# Patient Record
Sex: Female | Born: 1940 | Race: White | Hispanic: No | Marital: Single | Smoking: Former smoker
Health system: Southern US, Community
[De-identification: ages and names within clinical notes are randomized; demographics above are authoritative.]

## PROBLEM LIST (undated history)

## (undated) DIAGNOSIS — N63 Unspecified lump in unspecified breast: Secondary | ICD-10-CM

## (undated) DIAGNOSIS — M1990 Unspecified osteoarthritis, unspecified site: Secondary | ICD-10-CM

## (undated) DIAGNOSIS — I10 Essential (primary) hypertension: Secondary | ICD-10-CM

## (undated) DIAGNOSIS — T7840XA Allergy, unspecified, initial encounter: Secondary | ICD-10-CM

## (undated) HISTORY — PX: CARPAL TUNNEL RELEASE: SHX101

## (undated) HISTORY — DX: Unspecified lump in unspecified breast: N63.0

## (undated) HISTORY — DX: Essential (primary) hypertension: I10

## (undated) HISTORY — DX: Allergy, unspecified, initial encounter: T78.40XA

## (undated) HISTORY — DX: Unspecified osteoarthritis, unspecified site: M19.90

## (undated) HISTORY — PX: FOOT SURGERY: SHX648

---

## 1973-05-25 HISTORY — PX: ABDOMINAL HYSTERECTOMY: SHX81

## 1995-05-26 HISTORY — PX: TOTAL KNEE ARTHROPLASTY: SHX125

## 1998-05-25 HISTORY — PX: TOTAL KNEE ARTHROPLASTY: SHX125

## 2002-05-25 HISTORY — PX: COLONOSCOPY: SHX174

## 2003-02-23 ENCOUNTER — Ambulatory Visit (HOSPITAL_COMMUNITY): Admission: RE | Admit: 2003-02-23 | Discharge: 2003-02-23 | Payer: Self-pay | Admitting: Internal Medicine

## 2004-09-18 ENCOUNTER — Ambulatory Visit: Payer: Self-pay | Admitting: Internal Medicine

## 2004-09-23 ENCOUNTER — Ambulatory Visit: Payer: Self-pay | Admitting: *Deleted

## 2004-11-04 ENCOUNTER — Ambulatory Visit: Payer: Self-pay | Admitting: Internal Medicine

## 2006-08-19 ENCOUNTER — Ambulatory Visit: Payer: Self-pay | Admitting: General Surgery

## 2006-08-25 ENCOUNTER — Ambulatory Visit: Payer: Self-pay | Admitting: General Surgery

## 2007-07-13 ENCOUNTER — Ambulatory Visit: Payer: Self-pay | Admitting: Internal Medicine

## 2008-09-12 ENCOUNTER — Ambulatory Visit: Payer: Self-pay | Admitting: Family Medicine

## 2008-11-13 ENCOUNTER — Ambulatory Visit: Payer: Self-pay | Admitting: Unknown Physician Specialty

## 2008-11-22 ENCOUNTER — Ambulatory Visit: Payer: Self-pay | Admitting: Gastroenterology

## 2009-04-30 ENCOUNTER — Emergency Department: Payer: Self-pay | Admitting: Emergency Medicine

## 2009-10-10 ENCOUNTER — Ambulatory Visit: Payer: Self-pay | Admitting: Family Medicine

## 2010-10-16 ENCOUNTER — Ambulatory Visit: Payer: Self-pay | Admitting: Family Medicine

## 2010-10-31 ENCOUNTER — Ambulatory Visit: Payer: Self-pay | Admitting: Physician Assistant

## 2010-10-31 ENCOUNTER — Ambulatory Visit: Payer: Self-pay | Admitting: Orthopaedic Surgery

## 2011-12-08 ENCOUNTER — Ambulatory Visit: Payer: Self-pay | Admitting: Family Medicine

## 2011-12-21 ENCOUNTER — Ambulatory Visit: Payer: Self-pay | Admitting: Family Medicine

## 2012-05-25 DIAGNOSIS — N63 Unspecified lump in unspecified breast: Secondary | ICD-10-CM

## 2012-05-25 HISTORY — DX: Unspecified lump in unspecified breast: N63.0

## 2012-06-30 ENCOUNTER — Ambulatory Visit: Payer: Self-pay | Admitting: Family Medicine

## 2012-07-04 ENCOUNTER — Ambulatory Visit: Payer: Self-pay | Admitting: Family Medicine

## 2012-08-26 ENCOUNTER — Encounter: Payer: Self-pay | Admitting: *Deleted

## 2012-08-26 DIAGNOSIS — N63 Unspecified lump in unspecified breast: Secondary | ICD-10-CM | POA: Insufficient documentation

## 2012-09-05 ENCOUNTER — Ambulatory Visit (INDEPENDENT_AMBULATORY_CARE_PROVIDER_SITE_OTHER): Payer: Medicare Other | Admitting: General Surgery

## 2012-09-05 ENCOUNTER — Encounter: Payer: Self-pay | Admitting: General Surgery

## 2012-09-05 ENCOUNTER — Other Ambulatory Visit: Payer: Self-pay

## 2012-09-05 VITALS — BP 128/68 | HR 78 | Resp 16 | Ht 68.0 in | Wt 231.0 lb

## 2012-09-05 DIAGNOSIS — N63 Unspecified lump in unspecified breast: Secondary | ICD-10-CM

## 2012-09-05 NOTE — Patient Instructions (Addendum)
Will inform pt when cytology available. If negative for any suspicious findings will follow with right mammogram.Patient to return in August 2014 with a unilateral right breast diagnostic mammogram.

## 2012-09-05 NOTE — Progress Notes (Signed)
Patient ID: Olivia Martin, female   DOB: 12-01-1940, 72 y.o.   MRN: [id number]  Chief Complaint  Patient presents with  . Follow-up    2 month right breast mass ultrasound    HPI Olivia Martin is a 72 y.o. female here today following up from a right breast mass ultrasound. Patient was last seen 07/13/12 for a right breast biopsy which was not performed at that time. Patient states no new breast problems. HPI  Past Medical History  Diagnosis Date  . Allergy   . Hypertension   . Arthritis   . Lump or mass in breast 2014    Past Surgical History  Procedure Laterality Date  . Carpal tunnel release Bilateral [id number]  . Total knee arthroplasty Right 2000  . Total knee arthroplasty Left 1997  . Abdominal hysterectomy  1975  . Foot surgery Bilateral 2012,2013  . Colonoscopy  2004    [place], [place]    Family History  Problem Relation Age of Onset  . Cancer Mother     lung cancer, died at age 27  . Cancer Sister 81    breast cancer  . Cancer Brother 26    lung cancer    Social History History  Substance Use Topics  . Smoking status: Former Smoker -- 1.00 packs/day for 20 years    Types: Cigarettes  . Smokeless tobacco: Never Used  . Alcohol Use: No    Allergies  Allergen Reactions  . Prednisone Swelling and Other (See Comments)    "felt like I couldn't breathe"    Current Outpatient Prescriptions  Medication Sig Dispense Refill  . amLODipine (NORVASC) 5 MG tablet Take 5 mg by mouth daily.      Marland Kitchen aspirin EC 81 MG tablet Take 81 mg by mouth daily.      . Calcium Carbonate-Vitamin D (CALCIUM 600 + D PO) Take by mouth.      . hydrochlorothiazide (HYDRODIURIL) 25 MG tablet Take 25 mg by mouth daily.      . Multiple Vitamins-Minerals (CENTRUM SILVER ULTRA WOMENS PO) Take 1 tablet by mouth daily.      . nebivolol (BYSTOLIC) 10 MG tablet Take 10 mg by mouth daily.      . Omega-3 Fatty Acids (FISH OIL) 1000 MG CAPS Take 1 capsule by mouth daily.      .  rosuvastatin (CRESTOR) 20 MG tablet Take 20 mg by mouth daily.      . sertraline (ZOLOFT) 50 MG tablet Take 50 mg by mouth daily.       No current facility-administered medications for this visit.    Review of Systems Review of Systems  Constitutional: Negative.   Respiratory: Negative.   Cardiovascular: Negative.     Blood pressure 128/68, pulse 78, resp. rate 16, height 5\' 8"  (1.727 m), weight 231 lb (104.781 kg).  Physical Exam Physical Exam  Constitutional: She appears well-developed and well-nourished.  Neck: No mass and no thyromegaly present.  Pulmonary/Chest: Right breast exhibits no inverted nipple, no mass, no nipple discharge, no skin change and no tenderness. Left breast exhibits no inverted nipple, no mass, no nipple discharge, no skin change and no tenderness. Breasts are symmetrical.    Data Reviewed US performed today again shows a triangular hypoechoic mass at 10 o'cl right as noted before , no changes.   Assessment    Right breast mass seen on imaging     Plan    FNA performed.  Abed Schar G 09/07/2012, 8:01 AM

## 2012-09-07 ENCOUNTER — Encounter: Payer: Self-pay | Admitting: General Surgery

## 2012-09-08 LAB — FINE-NEEDLE ASPIRATION

## 2012-09-12 ENCOUNTER — Encounter: Payer: Self-pay | Admitting: General Surgery

## 2012-09-13 ENCOUNTER — Telehealth: Payer: Self-pay | Admitting: *Deleted

## 2012-09-13 NOTE — Telephone Encounter (Signed)
Notified patient as instructed, patient pleased. Discussed follow-up appointments   

## 2012-09-13 NOTE — Telephone Encounter (Signed)
Message copied by Currie Paris on Tue Sep 13, 2012 10:00 AM ------      Message from: Kieth Brightly      Created: Mon Sep 12, 2012  1:55 PM       Please let pt pt know the cytology  was normal. Follow up as scheduled ------

## 2013-01-17 ENCOUNTER — Ambulatory Visit: Payer: Medicare Other | Admitting: General Surgery

## 2013-01-24 ENCOUNTER — Ambulatory Visit: Payer: Self-pay | Admitting: General Surgery

## 2013-01-25 ENCOUNTER — Encounter: Payer: Self-pay | Admitting: General Surgery

## 2013-01-25 ENCOUNTER — Ambulatory Visit: Payer: Self-pay | Admitting: General Surgery

## 2013-01-26 ENCOUNTER — Encounter: Payer: Self-pay | Admitting: General Surgery

## 2013-01-31 ENCOUNTER — Encounter: Payer: Self-pay | Admitting: General Surgery

## 2013-01-31 ENCOUNTER — Ambulatory Visit (INDEPENDENT_AMBULATORY_CARE_PROVIDER_SITE_OTHER): Payer: Medicare Other | Admitting: General Surgery

## 2013-01-31 VITALS — BP 130/80 | HR 74 | Resp 14 | Ht 68.0 in | Wt 226.0 lb

## 2013-01-31 DIAGNOSIS — N63 Unspecified lump in unspecified breast: Secondary | ICD-10-CM

## 2013-01-31 NOTE — Progress Notes (Signed)
Patient ID: Olivia Martin, female   DOB: 06-11-1940, 72 y.o.   MRN: [id number]  Chief Complaint  Patient presents with  . Follow-up    4 month follow up right breast mammogram.     HPI Olivia Martin is a 72 y.o. female who presents for a breast evaluation. The patient was initially seen in February 2014 for a right breast mass. A breast biopsy was not performed at that time. The patient was followed 2 months later with a breast ultrasound in our office. The most recent mammogram was done on 01/24/13 with a birad category 0. A right breat ultrasound was done on 01/25/13 with a birad category 2. Patient does not perform regular self breast checks but does gets regular mammograms done.  She denies any new problems with the breast at this time.    HPI  Past Medical History  Diagnosis Date  . Allergy   . Hypertension   . Arthritis   . Lump or mass in breast 2014    Past Surgical History  Procedure Laterality Date  . Carpal tunnel release Bilateral [id number]  . Total knee arthroplasty Right 2000  . Total knee arthroplasty Left 1997  . Abdominal hysterectomy  1975  . Foot surgery Bilateral 2012,2013  . Colonoscopy  2004    [place], [place]    Family History  Problem Relation Age of Onset  . Cancer Mother     lung cancer, died at age 33  . Cancer Sister 13    breast cancer  . Cancer Brother 100    lung cancer    Social History History  Substance Use Topics  . Smoking status: Former Smoker -- 1.00 packs/day for 20 years    Types: Cigarettes  . Smokeless tobacco: Never Used  . Alcohol Use: No    Allergies  Allergen Reactions  . Prednisone Swelling and Other (See Comments)    "felt like I couldn't breathe"    Current Outpatient Prescriptions  Medication Sig Dispense Refill  . amLODipine (NORVASC) 5 MG tablet Take 5 mg by mouth daily.      Marland Kitchen aspirin EC 81 MG tablet Take 81 mg by mouth daily.      . Calcium Carbonate-Vitamin D (CALCIUM 600 + D PO) Take by mouth.       . hydrochlorothiazide (HYDRODIURIL) 25 MG tablet Take 25 mg by mouth daily.      . Multiple Vitamins-Minerals (CENTRUM SILVER ULTRA WOMENS PO) Take 1 tablet by mouth daily.      . nebivolol (BYSTOLIC) 10 MG tablet Take 10 mg by mouth daily.      . Omega-3 Fatty Acids (FISH OIL) 1000 MG CAPS Take 1 capsule by mouth daily.      . rosuvastatin (CRESTOR) 20 MG tablet Take 20 mg by mouth daily.      . sertraline (ZOLOFT) 50 MG tablet Take 50 mg by mouth daily.       No current facility-administered medications for this visit.    Review of Systems Review of Systems  Constitutional: Negative.   Respiratory: Negative.   Cardiovascular: Negative.     Blood pressure 130/80, pulse 74, resp. rate 14, height 5\' 8"  (1.727 m), weight 226 lb (102.513 kg).  Physical Exam Physical Exam  Constitutional: She is oriented to person, place, and time. She appears well-developed and well-nourished.  Eyes: Conjunctivae are normal. No scleral icterus.  Neck: No thyromegaly present.  Pulmonary/Chest: Right breast exhibits no inverted nipple, no mass, no nipple discharge,  no skin change and no tenderness. Left breast exhibits no inverted nipple, no mass, no nipple discharge, no skin change and no tenderness.  Lymphadenopathy:    She has no cervical adenopathy.    She has no axillary adenopathy.  Neurological: She is alert and oriented to person, place, and time.  Skin: Skin is warm and dry.    Data Reviewed Mammogram and ultrasound reviewed.   Assessment    Exam stable. With a stable nodule right breast. Negative FNA 2 months ago.     Plan    Return in 1 year bilateral screening mammogram at [facility].        SANKAR,SEEPLAPUTHUR G 01/31/2013, 6:38 PM

## 2013-01-31 NOTE — Patient Instructions (Addendum)
Patient to return in 1 year with bilateral screening mammogram. 

## 2014-01-30 ENCOUNTER — Encounter: Payer: Self-pay | Admitting: General Surgery

## 2014-01-30 ENCOUNTER — Ambulatory Visit: Payer: Self-pay | Admitting: General Surgery

## 2014-02-13 ENCOUNTER — Encounter: Payer: Self-pay | Admitting: General Surgery

## 2014-02-13 ENCOUNTER — Ambulatory Visit (INDEPENDENT_AMBULATORY_CARE_PROVIDER_SITE_OTHER): Payer: Medicare Other | Admitting: General Surgery

## 2014-02-13 VITALS — BP 136/72 | HR 74 | Resp 16 | Ht 67.0 in | Wt 236.0 lb

## 2014-02-13 DIAGNOSIS — Z803 Family history of malignant neoplasm of breast: Secondary | ICD-10-CM | POA: Insufficient documentation

## 2014-02-13 DIAGNOSIS — N63 Unspecified lump in unspecified breast: Secondary | ICD-10-CM

## 2014-02-13 NOTE — Patient Instructions (Signed)
Patient will be asked to return to the office in one year with a bilateral screening mammogram. 

## 2014-02-13 NOTE — Progress Notes (Signed)
Patient ID: Olivia Martin, female   DOB: February 24, 1941, 73 y.o.   MRN: [id number]  Chief Complaint  Patient presents with  . Follow-up    mammogram    HPI Olivia Martin is a 73 y.o. female who presents for a breast evaluation. The most recent mammogram was done on 01/30/14 .  Patient does perform regular self breast checks and gets regular mammograms done.  Patient fell face first on  02/08/14 . States her breast are bruised.   HPI  Past Medical History  Diagnosis Date  . Allergy   . Hypertension   . Arthritis   . Lump or mass in breast 2014    Past Surgical History  Procedure Laterality Date  . Carpal tunnel release Bilateral [id number]  . Total knee arthroplasty Right 2000  . Total knee arthroplasty Left 1997  . Abdominal hysterectomy  1975  . Foot surgery Bilateral 2012,2013  . Colonoscopy  2004    [place], [place]    Family History  Problem Relation Age of Onset  . Cancer Mother     lung cancer, died at age 29  . Cancer Sister 49    breast cancer  . Cancer Brother 40    lung cancer    Social History History  Substance Use Topics  . Smoking status: Former Smoker -- 1.00 packs/day for 20 years    Types: Cigarettes  . Smokeless tobacco: Never Used  . Alcohol Use: No    Allergies  Allergen Reactions  . Prednisone Swelling and Other (See Comments)    "felt like I couldn't breathe"    Current Outpatient Prescriptions  Medication Sig Dispense Refill  . amLODipine (NORVASC) 5 MG tablet Take 5 mg by mouth daily.      Marland Kitchen aspirin EC 81 MG tablet Take 81 mg by mouth daily.      . Calcium Carbonate-Vitamin D (CALCIUM 600 + D PO) Take by mouth.      . hydrochlorothiazide (HYDRODIURIL) 25 MG tablet Take 25 mg by mouth daily.      . Multiple Vitamins-Minerals (CENTRUM SILVER ULTRA WOMENS PO) Take 1 tablet by mouth daily.      . nebivolol (BYSTOLIC) 10 MG tablet Take 10 mg by mouth daily.      . Omega-3 Fatty Acids (FISH OIL) 1000 MG CAPS Take 1 capsule by mouth  daily.      . rosuvastatin (CRESTOR) 20 MG tablet Take 20 mg by mouth daily.      . sertraline (ZOLOFT) 50 MG tablet Take 50 mg by mouth daily.       No current facility-administered medications for this visit.    Review of Systems Review of Systems  Constitutional: Negative.   Respiratory: Negative.   Cardiovascular: Negative.     Blood pressure 136/72, pulse 74, resp. rate 16, height  (1.702 m), weight 236 lb (107.049 kg).  Physical Exam Physical Exam  Constitutional: She is oriented to person, place, and time. She appears well-developed and well-nourished.  Eyes: Conjunctivae are normal. No scleral icterus.  Neck: Neck supple.  Cardiovascular: Normal rate, regular rhythm and normal heart sounds.   Pulmonary/Chest: Effort normal and breath sounds normal. Right breast exhibits no inverted nipple, no mass, no nipple discharge, no skin change and no tenderness. Left breast exhibits no inverted nipple, no mass, no nipple discharge, no skin change and no tenderness.  Abdominal: Soft. Bowel sounds are normal. There is no tenderness.  Lymphadenopathy:    She has no cervical adenopathy.  She has no axillary adenopathy.  Neurological: She is alert and oriented to person, place, and time.  Skin: Skin is warm and dry.    Data Reviewed Mammogram reviewed and stable  Assessment    Stable exam     Plan    Patient will be asked to return to the office in one year with a bilateral screening mammogram.       Olivia Martin 02/13/2014, 9:48 PM

## 2014-03-26 ENCOUNTER — Encounter: Payer: Self-pay | Admitting: General Surgery

## 2014-09-07 ENCOUNTER — Other Ambulatory Visit: Payer: Self-pay | Admitting: Family Medicine

## 2014-09-07 DIAGNOSIS — Z1231 Encounter for screening mammogram for malignant neoplasm of breast: Secondary | ICD-10-CM

## 2015-02-11 ENCOUNTER — Ambulatory Visit: Payer: Medicare Other | Admitting: General Surgery

## 2015-02-25 ENCOUNTER — Ambulatory Visit
Admission: RE | Admit: 2015-02-25 | Discharge: 2015-02-25 | Disposition: A | Discharge status: Home / Self Care | Payer: Medicare Other | Source: Ambulatory Visit | Attending: Family Medicine | Admitting: Family Medicine

## 2015-02-25 DIAGNOSIS — Z1231 Encounter for screening mammogram for malignant neoplasm of breast: Secondary | ICD-10-CM | POA: Diagnosis present

## 2015-03-05 ENCOUNTER — Ambulatory Visit: Payer: Medicare Other | Admitting: General Surgery

## 2015-05-08 ENCOUNTER — Encounter: Payer: Self-pay | Admitting: *Deleted

## 2016-03-19 ENCOUNTER — Other Ambulatory Visit: Payer: Self-pay | Admitting: Family Medicine

## 2016-03-19 DIAGNOSIS — Z1231 Encounter for screening mammogram for malignant neoplasm of breast: Secondary | ICD-10-CM

## 2016-04-24 ENCOUNTER — Ambulatory Visit
Admission: RE | Admit: 2016-04-24 | Discharge: 2016-04-24 | Disposition: A | Discharge status: Home / Self Care | Payer: Medicare Other | Source: Ambulatory Visit | Attending: Family Medicine | Admitting: Family Medicine

## 2016-04-24 DIAGNOSIS — Z1231 Encounter for screening mammogram for malignant neoplasm of breast: Secondary | ICD-10-CM | POA: Diagnosis present

## 2016-10-15 ENCOUNTER — Other Ambulatory Visit: Payer: Self-pay | Admitting: Nurse Practitioner

## 2016-10-15 DIAGNOSIS — R519 Headache, unspecified: Secondary | ICD-10-CM

## 2016-10-15 DIAGNOSIS — R51 Headache: Principal | ICD-10-CM

## 2016-11-03 ENCOUNTER — Ambulatory Visit: Payer: Medicare Other

## 2016-11-04 ENCOUNTER — Ambulatory Visit
Admission: RE | Admit: 2016-11-04 | Discharge: 2016-11-04 | Disposition: A | Discharge status: Home / Self Care | Payer: Medicare Other | Source: Ambulatory Visit | Attending: Nurse Practitioner | Admitting: Nurse Practitioner

## 2016-11-04 DIAGNOSIS — G319 Degenerative disease of nervous system, unspecified: Secondary | ICD-10-CM | POA: Insufficient documentation

## 2016-11-04 DIAGNOSIS — R519 Headache, unspecified: Secondary | ICD-10-CM

## 2016-11-04 DIAGNOSIS — R51 Headache: Secondary | ICD-10-CM | POA: Diagnosis present

## 2016-11-04 LAB — POCT I-STAT CREATININE: Creatinine, Ser: 0.9 mg/dL (ref 0.44–1.00)

## 2016-11-04 MED ORDER — GADOBENATE DIMEGLUMINE 529 MG/ML IV SOLN
20.0000 mL | Freq: Once | INTRAVENOUS | Status: AC | PRN
  Administered 2016-11-04: 20 mL via INTRAVENOUS

## 2017-03-30 ENCOUNTER — Other Ambulatory Visit: Payer: Self-pay | Admitting: Family Medicine

## 2017-03-30 DIAGNOSIS — Z1231 Encounter for screening mammogram for malignant neoplasm of breast: Secondary | ICD-10-CM

## 2017-04-27 ENCOUNTER — Ambulatory Visit
Admission: RE | Admit: 2017-04-27 | Discharge: 2017-04-27 | Disposition: A | Discharge status: Home / Self Care | Payer: Medicare Other | Source: Ambulatory Visit | Attending: Family Medicine | Admitting: Family Medicine

## 2017-04-27 DIAGNOSIS — Z1231 Encounter for screening mammogram for malignant neoplasm of breast: Secondary | ICD-10-CM | POA: Diagnosis present

## 2018-03-31 ENCOUNTER — Other Ambulatory Visit: Payer: Self-pay | Admitting: Family Medicine

## 2018-03-31 DIAGNOSIS — Z1231 Encounter for screening mammogram for malignant neoplasm of breast: Secondary | ICD-10-CM

## 2018-05-03 ENCOUNTER — Ambulatory Visit
Admission: RE | Admit: 2018-05-03 | Discharge: 2018-05-03 | Disposition: A | Discharge status: Home / Self Care | Payer: Medicare Other | Source: Ambulatory Visit | Attending: Family Medicine | Admitting: Family Medicine

## 2018-05-03 DIAGNOSIS — Z1231 Encounter for screening mammogram for malignant neoplasm of breast: Secondary | ICD-10-CM

## 2018-11-08 ENCOUNTER — Other Ambulatory Visit: Payer: Self-pay | Admitting: Neurology

## 2018-11-08 DIAGNOSIS — F0280 Dementia in other diseases classified elsewhere without behavioral disturbance: Secondary | ICD-10-CM

## 2018-11-08 DIAGNOSIS — G301 Alzheimer's disease with late onset: Secondary | ICD-10-CM

## 2018-11-16 ENCOUNTER — Ambulatory Visit
Admission: RE | Admit: 2018-11-16 | Discharge: 2018-11-16 | Disposition: A | Discharge status: Home / Self Care | Payer: Medicare Other | Source: Ambulatory Visit | Attending: Neurology | Admitting: Neurology

## 2018-11-16 ENCOUNTER — Other Ambulatory Visit: Payer: Self-pay

## 2018-11-16 DIAGNOSIS — F05 Delirium due to known physiological condition: Secondary | ICD-10-CM | POA: Diagnosis present

## 2018-11-16 DIAGNOSIS — G301 Alzheimer's disease with late onset: Secondary | ICD-10-CM | POA: Insufficient documentation

## 2018-11-16 DIAGNOSIS — F0282 Dementia in other diseases classified elsewhere, unspecified severity, with psychotic disturbance: Secondary | ICD-10-CM

## 2018-11-16 DIAGNOSIS — F0280 Dementia in other diseases classified elsewhere without behavioral disturbance: Secondary | ICD-10-CM | POA: Insufficient documentation

## 2019-01-20 ENCOUNTER — Other Ambulatory Visit: Payer: Self-pay | Admitting: *Deleted

## 2019-01-20 DIAGNOSIS — Z20822 Contact with and (suspected) exposure to covid-19: Secondary | ICD-10-CM

## 2019-01-21 LAB — NOVEL CORONAVIRUS, NAA: SARS-CoV-2, NAA: NOT DETECTED

## 2019-08-15 ENCOUNTER — Other Ambulatory Visit: Payer: Self-pay | Admitting: Gastroenterology

## 2019-08-15 DIAGNOSIS — R1310 Dysphagia, unspecified: Secondary | ICD-10-CM

## 2019-08-23 ENCOUNTER — Ambulatory Visit
Admission: RE | Admit: 2019-08-23 | Discharge: 2019-08-23 | Disposition: A | Discharge status: Home / Self Care | Payer: Medicare Other | Source: Ambulatory Visit | Attending: Gastroenterology | Admitting: Gastroenterology

## 2019-08-23 ENCOUNTER — Other Ambulatory Visit: Payer: Self-pay

## 2019-08-23 DIAGNOSIS — R1310 Dysphagia, unspecified: Secondary | ICD-10-CM | POA: Insufficient documentation

## 2020-11-23 ENCOUNTER — Emergency Department: Payer: Medicare Other

## 2020-11-23 ENCOUNTER — Other Ambulatory Visit: Payer: Self-pay

## 2020-11-23 ENCOUNTER — Inpatient Hospital Stay
Admission: EM | Admit: 2020-11-23 | Discharge: 2020-11-27 | DRG: 872 | Disposition: A | Discharge status: Home Health | Payer: Medicare Other | Attending: Internal Medicine | Admitting: Internal Medicine

## 2020-11-23 DIAGNOSIS — Z79899 Other long term (current) drug therapy: Secondary | ICD-10-CM

## 2020-11-23 DIAGNOSIS — R059 Cough, unspecified: Secondary | ICD-10-CM | POA: Diagnosis not present

## 2020-11-23 DIAGNOSIS — D696 Thrombocytopenia, unspecified: Secondary | ICD-10-CM | POA: Diagnosis present

## 2020-11-23 DIAGNOSIS — E785 Hyperlipidemia, unspecified: Secondary | ICD-10-CM

## 2020-11-23 DIAGNOSIS — G301 Alzheimer's disease with late onset: Secondary | ICD-10-CM

## 2020-11-23 DIAGNOSIS — Z87891 Personal history of nicotine dependence: Secondary | ICD-10-CM

## 2020-11-23 DIAGNOSIS — Z96653 Presence of artificial knee joint, bilateral: Secondary | ICD-10-CM | POA: Diagnosis present

## 2020-11-23 DIAGNOSIS — G8929 Other chronic pain: Secondary | ICD-10-CM | POA: Diagnosis present

## 2020-11-23 DIAGNOSIS — A419 Sepsis, unspecified organism: Principal | ICD-10-CM

## 2020-11-23 DIAGNOSIS — F0280 Dementia in other diseases classified elsewhere without behavioral disturbance: Secondary | ICD-10-CM

## 2020-11-23 DIAGNOSIS — E876 Hypokalemia: Secondary | ICD-10-CM | POA: Diagnosis present

## 2020-11-23 DIAGNOSIS — N390 Urinary tract infection, site not specified: Secondary | ICD-10-CM | POA: Diagnosis present

## 2020-11-23 DIAGNOSIS — Z28310 Unvaccinated for covid-19: Secondary | ICD-10-CM

## 2020-11-23 DIAGNOSIS — I10 Essential (primary) hypertension: Secondary | ICD-10-CM

## 2020-11-23 DIAGNOSIS — D72819 Decreased white blood cell count, unspecified: Secondary | ICD-10-CM | POA: Diagnosis present

## 2020-11-23 DIAGNOSIS — F32A Depression, unspecified: Secondary | ICD-10-CM

## 2020-11-23 DIAGNOSIS — Z7982 Long term (current) use of aspirin: Secondary | ICD-10-CM

## 2020-11-23 DIAGNOSIS — Z801 Family history of malignant neoplasm of trachea, bronchus and lung: Secondary | ICD-10-CM

## 2020-11-23 DIAGNOSIS — Z66 Do not resuscitate: Secondary | ICD-10-CM | POA: Diagnosis present

## 2020-11-23 DIAGNOSIS — R3129 Other microscopic hematuria: Secondary | ICD-10-CM | POA: Diagnosis present

## 2020-11-23 DIAGNOSIS — Z20822 Contact with and (suspected) exposure to covid-19: Secondary | ICD-10-CM | POA: Diagnosis present

## 2020-11-23 DIAGNOSIS — Z87440 Personal history of urinary (tract) infections: Secondary | ICD-10-CM

## 2020-11-23 DIAGNOSIS — G309 Alzheimer's disease, unspecified: Secondary | ICD-10-CM | POA: Diagnosis present

## 2020-11-23 DIAGNOSIS — M549 Dorsalgia, unspecified: Secondary | ICD-10-CM | POA: Diagnosis present

## 2020-11-23 DIAGNOSIS — Z803 Family history of malignant neoplasm of breast: Secondary | ICD-10-CM

## 2020-11-23 LAB — COMPREHENSIVE METABOLIC PANEL
ALT: 56 U/L — ABNORMAL HIGH (ref 0–44)
AST: 99 U/L — ABNORMAL HIGH (ref 15–41)
Albumin: 3.9 g/dL (ref 3.5–5.0)
Alkaline Phosphatase: 43 U/L (ref 38–126)
Anion gap: 12 (ref 5–15)
BUN: 21 mg/dL (ref 8–23)
CO2: 21 mmol/L — ABNORMAL LOW (ref 22–32)
Calcium: 9 mg/dL (ref 8.9–10.3)
Chloride: 105 mmol/L (ref 98–111)
Creatinine, Ser: 0.85 mg/dL (ref 0.44–1.00)
GFR, Estimated: >60 mL/min (ref >60)
Glucose, Bld: 155 mg/dL — ABNORMAL HIGH (ref 70–99)
Potassium: 2.9 mmol/L — ABNORMAL LOW (ref 3.5–5.1)
Sodium: 138 mmol/L (ref 135–145)
Total Bilirubin: 0.9 mg/dL (ref 0.3–1.2)
Total Protein: 7.5 g/dL (ref 6.5–8.1)

## 2020-11-23 LAB — URINALYSIS, COMPLETE (UACMP) WITH MICROSCOPIC
Bilirubin Urine: NEGATIVE
Glucose, UA: NEGATIVE mg/dL
Ketones, ur: 5 mg/dL — AB
Leukocytes,Ua: NEGATIVE
Nitrite: POSITIVE — AB
Protein, ur: NEGATIVE mg/dL
Specific Gravity, Urine: 1.028 (ref 1.005–1.030)
pH: 5 (ref 5.0–8.0)

## 2020-11-23 LAB — CBC WITH DIFFERENTIAL/PLATELET
Abs Immature Granulocytes: 0.02 10*3/uL (ref 0.00–0.07)
Basophils Absolute: 0 10*3/uL (ref 0.0–0.1)
Basophils Relative: 1 %
Eosinophils Absolute: 0 10*3/uL (ref 0.0–0.5)
Eosinophils Relative: 1 %
HCT: 43.7 % (ref 36.0–46.0)
Hemoglobin: 14.9 g/dL (ref 12.0–15.0)
Immature Granulocytes: 0 %
Lymphocytes Relative: 6 %
Lymphs Abs: 0.3 10*3/uL — ABNORMAL LOW (ref 0.7–4.0)
MCH: 32.4 pg (ref 26.0–34.0)
MCHC: 34.1 g/dL (ref 30.0–36.0)
MCV: 95 fL (ref 80.0–100.0)
Monocytes Absolute: 0.5 10*3/uL (ref 0.1–1.0)
Monocytes Relative: 8 %
Neutro Abs: 4.7 10*3/uL (ref 1.7–7.7)
Neutrophils Relative %: 84 %
Platelets: 114 10*3/uL — ABNORMAL LOW (ref 150–400)
RBC: 4.6 MIL/uL (ref 3.87–5.11)
RDW: 12.8 % (ref 11.5–15.5)
WBC: 5.6 10*3/uL (ref 4.0–10.5)
nRBC: 0 % (ref 0.0–0.2)

## 2020-11-23 LAB — PROTIME-INR
INR: 1.1 (ref 0.8–1.2)
Prothrombin Time: 14.2 seconds (ref 11.4–15.2)

## 2020-11-23 LAB — RESP PANEL BY RT-PCR (FLU A&B, COVID) ARPGX2
Influenza A by PCR: NEGATIVE
Influenza B by PCR: NEGATIVE
SARS Coronavirus 2 by RT PCR: NEGATIVE

## 2020-11-23 LAB — LACTIC ACID, PLASMA: Lactic Acid, Venous: 2.1 mmol/L (ref 0.5–1.9)

## 2020-11-23 LAB — MAGNESIUM: Magnesium: 2 mg/dL (ref 1.7–2.4)

## 2020-11-23 LAB — PROCALCITONIN: Procalcitonin: 0.19 ng/mL

## 2020-11-23 MED ORDER — LACTATED RINGERS IV SOLN: INTRAVENOUS | Status: AC

## 2020-11-23 MED ORDER — SODIUM CHLORIDE 0.9 % IV SOLN
2.0000 g | Freq: Once | INTRAVENOUS | Status: AC
  Administered 2020-11-23: 2 g via INTRAVENOUS
  Filled 2020-11-23: qty 2

## 2020-11-23 MED ORDER — VANCOMYCIN HCL IN DEXTROSE 1-5 GM/200ML-% IV SOLN
1000.0000 mg | Freq: Once | INTRAVENOUS | Status: AC
  Administered 2020-11-23: 1000 mg via INTRAVENOUS

## 2020-11-23 MED ORDER — PANTOPRAZOLE SODIUM 40 MG PO TBEC
40.0000 mg | DELAYED_RELEASE_TABLET | Freq: Every morning | ORAL | Status: DC
  Administered 2020-11-24 – 2020-11-27 (×4): 40 mg via ORAL
  Filled 2020-11-23 (×4): qty 1

## 2020-11-23 MED ORDER — SODIUM CHLORIDE 0.9 % IV BOLUS
1000.0000 mL | Freq: Once | INTRAVENOUS | Status: AC
  Administered 2020-11-23: 1000 mL via INTRAVENOUS

## 2020-11-23 MED ORDER — ONDANSETRON HCL 4 MG/2ML IJ SOLN: 4.0000 mg | Freq: Four times a day (QID) | INTRAMUSCULAR | Status: DC | PRN

## 2020-11-23 MED ORDER — ACETAMINOPHEN 325 MG PO TABS
650.0000 mg | ORAL_TABLET | Freq: Once | ORAL | Status: AC | PRN
  Administered 2020-11-23: 650 mg via ORAL
  Filled 2020-11-23: qty 2

## 2020-11-23 MED ORDER — ENOXAPARIN SODIUM 40 MG/0.4ML IJ SOSY
40.0000 mg | PREFILLED_SYRINGE | INTRAMUSCULAR | Status: DC
  Administered 2020-11-23: 40 mg via SUBCUTANEOUS
  Filled 2020-11-23: qty 0.4

## 2020-11-23 MED ORDER — MEMANTINE HCL 5 MG PO TABS
10.0000 mg | ORAL_TABLET | Freq: Two times a day (BID) | ORAL | Status: DC
  Administered 2020-11-23 – 2020-11-27 (×8): 10 mg via ORAL
  Filled 2020-11-23 (×8): qty 2

## 2020-11-23 MED ORDER — POTASSIUM CITRATE-CITRIC ACID 1100-334 MG/5ML PO SOLN
40.0000 meq | Freq: Once | ORAL | Status: DC
  Filled 2020-11-23: qty 20

## 2020-11-23 MED ORDER — METRONIDAZOLE 500 MG/100ML IV SOLN
500.0000 mg | Freq: Once | INTRAVENOUS | Status: AC
  Administered 2020-11-23: 500 mg via INTRAVENOUS
  Filled 2020-11-23: qty 100

## 2020-11-23 MED ORDER — ROSUVASTATIN CALCIUM 20 MG PO TABS: 20.0000 mg | ORAL_TABLET | Freq: Every day | ORAL | Status: DC

## 2020-11-23 MED ORDER — ROSUVASTATIN CALCIUM 10 MG PO TABS
20.0000 mg | ORAL_TABLET | Freq: Every day | ORAL | Status: DC
  Administered 2020-11-24 – 2020-11-26 (×3): 20 mg via ORAL
  Filled 2020-11-23: qty 2
  Filled 2020-11-23: qty 1
  Filled 2020-11-23 (×2): qty 2

## 2020-11-23 MED ORDER — SODIUM CHLORIDE 0.9 % IV BOLUS (SEPSIS)
1000.0000 mL | Freq: Once | INTRAVENOUS | Status: AC
  Administered 2020-11-23: 1000 mL via INTRAVENOUS

## 2020-11-23 MED ORDER — DIVALPROEX SODIUM 125 MG PO DR TAB
125.0000 mg | DELAYED_RELEASE_TABLET | Freq: Two times a day (BID) | ORAL | Status: DC
  Administered 2020-11-23 – 2020-11-26 (×6): 125 mg via ORAL
  Filled 2020-11-23 (×7): qty 1

## 2020-11-23 MED ORDER — VANCOMYCIN HCL IN DEXTROSE 1-5 GM/200ML-% IV SOLN
1000.0000 mg | Freq: Once | INTRAVENOUS | Status: AC
  Administered 2020-11-23: 1000 mg via INTRAVENOUS
  Filled 2020-11-23: qty 200

## 2020-11-23 MED ORDER — ASPIRIN EC 81 MG PO TBEC
81.0000 mg | DELAYED_RELEASE_TABLET | Freq: Every day | ORAL | Status: DC
  Administered 2020-11-24: 81 mg via ORAL
  Filled 2020-11-23: qty 1

## 2020-11-23 MED ORDER — SODIUM CHLORIDE 0.9 % IV SOLN
1.0000 g | INTRAVENOUS | Status: DC
  Administered 2020-11-24 – 2020-11-26 (×3): 1 g via INTRAVENOUS
  Filled 2020-11-23: qty 10
  Filled 2020-11-23: qty 1
  Filled 2020-11-23 (×2): qty 10

## 2020-11-23 MED ORDER — ACETAMINOPHEN 650 MG RE SUPP: 650.0000 mg | Freq: Four times a day (QID) | RECTAL | Status: DC | PRN

## 2020-11-23 MED ORDER — POTASSIUM CHLORIDE 10 MEQ/100ML IV SOLN
10.0000 meq | INTRAVENOUS | Status: AC
  Administered 2020-11-23 – 2020-11-24 (×3): 10 meq via INTRAVENOUS
  Filled 2020-11-23: qty 100

## 2020-11-23 MED ORDER — ONDANSETRON HCL 4 MG PO TABS: 4.0000 mg | ORAL_TABLET | Freq: Four times a day (QID) | ORAL | Status: DC | PRN

## 2020-11-23 MED ORDER — DONEPEZIL HCL 5 MG PO TABS
10.0000 mg | ORAL_TABLET | Freq: Every day | ORAL | Status: DC
  Administered 2020-11-23 – 2020-11-26 (×4): 10 mg via ORAL
  Filled 2020-11-23 (×4): qty 2

## 2020-11-23 MED ORDER — ACETAMINOPHEN 325 MG PO TABS
650.0000 mg | ORAL_TABLET | Freq: Four times a day (QID) | ORAL | Status: DC | PRN
  Administered 2020-11-25 – 2020-11-26 (×2): 650 mg via ORAL
  Filled 2020-11-23 (×2): qty 2

## 2020-11-23 MED ORDER — LACTATED RINGERS IV SOLN: INTRAVENOUS | Status: DC

## 2020-11-23 NOTE — Progress Notes (Signed)
PHARMACY -  BRIEF ANTIBIOTIC NOTE   Pharmacy has received consult(s) for Cefepime and Vancomcyin from an ED provider.  The patient's profile has been reviewed for ht/wt/allergies/indication/available labs.    RN messaged @ 1546 about need height and weight for patient.   One time order(s) placed for Vancomycin 1000mg  plus vancomycin 1000mg  (Total 2000mg ) and Cefepime 2g  Further antibiotics/pharmacy consults should be ordered by admitting physician if indicated.                       Thank you, , PharmD, BCPS Clinical Pharmacist 11/23/2020 3:51 PM

## 2020-11-23 NOTE — H&P (Addendum)
History and Physical   Olivia Martin NWG:[id number] DOB: 11-25-1940 DOA: 11/23/2020  PCP: [name], MD  Outpatient Specialists: Dr. Gillermo Murdoch cardiology Patient coming from: Urgent care  I have personally briefly reviewed patient's old medical records in [facility] EMR.  Chief Concern: Lethargy and confusion  HPI: Olivia Martin is a 80 y.o. female with medical history significant for hypertension, Alzheimer's dementia, hyperlipidemia, history of bradycardia, presents to the emergency department from urgent care/walk-in clinic for chief concerns of lethargy.  At bedside patient was able to tell me her full name.  She was not able to tell me her age.  She was not able to identify the daughter at bedside.  She does note that it is a daughter but does not know the daughter's name.  She does not know the current calendar year nor her current location.  Per daughter Olivia Martin at bedside, patient's lethargy and slow to move started on 11/22/20. The fever started today. Family denies vomiting. Patient has baseline loose stool, multiple times daily since starting dementia medication.  Patient also has chronic back pain that has been ongoing for about 1 year.  Daughter reports poor PO intake for two days.   Social history: She lives by herself and a daughter lives next door. She denies tobacco, etoh, recreational drug use. She is retired and formerly worked in a factory producing cigarettes.  Vaccination history: no covid 19 vaccination  ROS unable to complete due to Alzheimer dementia  ED Course: Discussed with emergency medicine provider, patient requiring hospitalization for meeting sepsis criteria.  Vitals in the emergency department was remarkable for temperature of 109.5, respiration rate of 20, heart rate 101, blood pressure 136/72, SPO2 of 91% on room air.  Labs in the emergency department was remarkable for sodium of 138, potassium 2.9, chloride 105, BUN 21, serum  creatinine of 0.  8 5, nonfasting blood glucose 155, WBC 5.6, hemoglobin 14.9, platelets 114.  UA was positive for nitrate.  AST 99, ALT 56.  Initial lactic acid was 2.1.  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   Depression   Alzheimer's dementia [facility])   # Patient meets sepsis criteria # Sepsis secondary to urinary tract infection - Elevated heart rate, fever, T-max at home of 102.9, source of urine - Status post 3 L normal saline bolus - EDP ordered lactated ringer 150 mL/h - Status post sepsis broad-spectrum antibiotic with vancomycin, metronidazole, cefepime per EDP - Ceftriaxone ordered by myself 1 g IV every 24 hours for UTI to start 11/24/2020 - Urine in pureWick cannister is dark - Blood cultures x2 and urine culture are in process  # Hypokalemia suspect secondary to hydrochlorothiazide - Replace with potassium 10 mill equivalent, x4, Polycitra 40 mill equivalent once - Add magnesium to previous cultures   # History of hypertension-patient took her hydrochlorothiazide 25 mg daily, losartan 25 mg daily, amlodipine 10 mg daily in the a.m. prior to seeking medical evaluation - As patient presents with hypotension meeting sepsis criteria, I have not resumed hydrochlorothiazide, losartan, amlodipine  # Hyperlipidemia-rosuvastatin 20 mg daily resumed  # Dementia, likely Alzheimer's-appears moderate to advanced - Resumed home donepezil 10 mg p.o. daily at bedtime - Memantine 10 mg p.o. twice daily  Chart reviewed.   Echo on 10/17/2020 was  read as ejection fraction estimated greater than 55%, normal left ventricular systolic and normal right ventricular systolic function.  No valvular stenosis.  Trivial MR.  Mild TR.  DVT prophylaxis: Enoxaparin 40 mg  subcutaneous every 24 hours Code Status: DNR Diet: Heart healthy Family Communication: Olivia Martin, daughter at bedside  Disposition Plan: Pending clinical course Consults called: no Admission status: med-surg, observation, telemetry for 24 hours  Past Medical History:  Diagnosis Date   Allergy    Arthritis    Hypertension    Lump or mass in breast 2014   Past Surgical History:  Procedure Laterality Date   ABDOMINAL HYSTERECTOMY  1975   CARPAL TUNNEL RELEASE Bilateral [id number]   COLONOSCOPY  2004   [place], [place]   FOOT SURGERY Bilateral 2012,2013   TOTAL KNEE ARTHROPLASTY Right 2000   TOTAL KNEE ARTHROPLASTY Left 1997   Social History:  reports that she has quit smoking. Her smoking use included cigarettes. She has a 20.00 pack-year smoking history. She has never used smokeless tobacco. She reports that she does not drink alcohol and does not use drugs.  Allergies  Allergen Reactions   Prednisone Swelling and Other (See Comments)    "felt like I couldn't breathe"   Family History  Problem Relation Age of Onset   Cancer Mother        lung cancer, died at age 73   Cancer Brother 68       lung cancer   Cancer Sister 43       breast cancer   Breast cancer Sister 17   Family history: Family history reviewed and not pertinent  Prior to Admission medications   Medication Sig Start Date End Date Taking? Authorizing Provider  amLODipine (NORVASC) 10 MG tablet Take 10 mg by mouth daily.   Yes [provider]  aspirin EC 81 MG tablet Take 81 mg by mouth daily.   Yes [provider]  hydrochlorothiazide (HYDRODIURIL) 25 MG tablet Take 25 mg by mouth daily.   Yes [provider]  rosuvastatin (CRESTOR) 20 MG tablet Take 20 mg by mouth daily.   Yes [provider]  sertraline (ZOLOFT) 50 MG tablet Take 25 mg by mouth daily.   Yes [provider]   Physical Exam: Vitals:   11/23/20 1800 11/23/20 1830 11/23/20 1900 11/23/20 1930   BP: (!) 90/44 (!) 75/61 (!) 76/50 101/66  Pulse: (!) 56 79 67 63  Resp: 15 20 18 19   Temp:      TempSrc:      SpO2: 94% 95% 94% 97%  Weight:      Height:       Constitutional: appears age-appropriate, frail, NAD, calm, comfortable Eyes: PERRL, lids and conjunctivae normal ENMT: Mucous membranes are dry. Posterior pharynx clear of any exudate or lesions. Age-appropriate dentition. Hearing appropriate Neck: normal, supple, no masses, no thyromegaly Respiratory: clear to auscultation bilaterally, no wheezing, no crackles. Normal respiratory effort. No accessory muscle use.  Cardiovascular: Regular rate and rhythm, no murmurs / rubs / gallops. No extremity edema. 2+ pedal pulses. No carotid bruits.  Abdomen: Obese abdomen with pannus, no tenderness, no masses palpated, no hepatosplenomegaly. Bowel sounds positive.  Musculoskeletal: no clubbing / cyanosis. No joint deformity upper and lower extremities. Good ROM, no contractures, no atrophy. Normal muscle tone.  GU: Pure wick Foley in place, urine in pure wick canister is dark Skin: no rashes, lesions, ulcers. No induration Neurologic: Sensation intact. Strength 5/5 in all 4.  Psychiatric: Normal judgment and  insight. Alert and oriented x self. Normal mood.   EKG: independently reviewed, showing sinus rhythm with rate of 99, QTc 467  Chest x-ray on Admission: I personally reviewed and I agree with radiologist reading as below.  DG Chest 2 View  Result Date: 11/23/2020 CLINICAL DATA:  Suspected sepsis. EXAM: CHEST - 2 VIEW COMPARISON:  07/13/2007 FINDINGS: Stable elevation of the RIGHT hemidiaphragm. Heart size is normal. The lungs are free of focal consolidations and pleural effusions. No pulmonary edema. Moderate thoracic degenerative changes. IMPRESSION: No evidence for acute  abnormality. Electronically Signed   By: Norva Pavlov M.D.   On: 11/23/2020 13:23    Labs on Admission: I have personally reviewed following  labs  CBC: Recent Labs  Lab 11/23/20 1236  WBC 5.6  NEUTROABS 4.7  HGB 14.9  HCT 43.7  MCV 95.0  PLT 114*   Basic Metabolic Panel: Recent Labs  Lab 11/23/20 1236  NA 138  K 2.9*  CL 105  CO2 21*  GLUCOSE 155*  BUN 21  CREATININE 0.85  CALCIUM 9.0   GFR: Estimated Creatinine Clearance: 60.6 mL/min (by C-G formula based on SCr of 0.85 mg/dL).  Liver Function Tests: Recent Labs  Lab 11/23/20 1236  AST 99*  ALT 56*  ALKPHOS 43  BILITOT 0.9  PROT 7.5  ALBUMIN 3.9   Coagulation Profile: Recent Labs  Lab 11/23/20 1236  INR 1.1   Urine analysis:    Component Value Date/Time   COLORURINE AMBER (A) 11/23/2020 1822   APPEARANCEUR HAZY (A) 11/23/2020 1822   LABSPEC 1.028 11/23/2020 1822   PHURINE 5.0 11/23/2020 1822   GLUCOSEU NEGATIVE 11/23/2020 1822   HGBUR MODERATE (A) 11/23/2020 1822   BILIRUBINUR NEGATIVE 11/23/2020 1822   KETONESUR 5 (A) 11/23/2020 1822   PROTEINUR NEGATIVE 11/23/2020 1822   NITRITE POSITIVE (A) 11/23/2020 1822   LEUKOCYTESUR NEGATIVE 11/23/2020 1822   CRITICAL CARE Performed by: Nadyne Coombes Wray Goehring  Total critical care time: 30 minutes  Critical care time was exclusive of separately billable procedures and treating other patients.  Critical care was necessary to treat or prevent imminent or life-threatening deterioration.  Critical care was time spent personally by me on the following activities: development of treatment plan with patient and/or surrogate as well as nursing, discussions with consultants, evaluation of patient's response to treatment, examination of patient, obtaining history from patient or surrogate, ordering and performing treatments and interventions, ordering and review of laboratory studies, ordering and review of radiographic studies, pulse oximetry and re-evaluation of patient's condition.  Dr. Sedalia Muta [facility]  If 7PM-7AM, please contact overnight-coverage provider If 7AM-7PM, please contact day coverage  provider [email or web address]  11/23/2020, 8:01 PM

## 2020-11-23 NOTE — ED Provider Notes (Signed)
[facility] Emergency Department Provider Note  Time seen: 3:47 PM  I have reviewed the triage vital signs and the nursing notes.   HISTORY  Chief Complaint Weakness   HPI Olivia Martin is a 80 y.o. female with a past medical history of allergies, arthritis, hypertension, presents to the emergency department for fever and generalized weakness.  According to the daughter since yesterday patient has been very weak and short of breath at times.  Noted to have a fever today.  Daughter states a history of frequent urinary tract infections.  Patient is too weak to stand or ambulate today.  At baseline patient ambulates with a walker but no other assistance needed.  Here the patient is awake and alert, no distress.  Slight cough this morning per daughter.  No vomiting, chronic diarrhea.  No dysuria.  No abdominal pain, no chest pain.   Past Medical History:  Diagnosis Date   Allergy    Arthritis    Hypertension    Lump or mass in breast 2014    Patient Active Problem List   Diagnosis Date Noted   Family history of breast cancer 02/13/2014   Lump or mass in breast     Past Surgical History:  Procedure Laterality Date   ABDOMINAL HYSTERECTOMY  1975   CARPAL TUNNEL RELEASE Bilateral [id number]   COLONOSCOPY  2004   [place], [place]   FOOT SURGERY Bilateral 2012,2013   TOTAL KNEE ARTHROPLASTY Right 2000   TOTAL KNEE ARTHROPLASTY Left 1997    Prior to Admission medications   Medication Sig Start Date End Date Taking? Authorizing Provider  amLODipine (NORVASC) 5 MG tablet Take 5 mg by mouth daily.    [provider]  aspirin EC 81 MG tablet Take 81 mg by mouth daily.    [provider]  Calcium Carbonate-Vitamin D (CALCIUM 600 + D PO) Take by mouth.    [provider]  hydrochlorothiazide (HYDRODIURIL) 25 MG tablet Take 25 mg by mouth daily.    [provider]  Multiple Vitamins-Minerals (CENTRUM SILVER ULTRA WOMENS PO)  Take 1 tablet by mouth daily.    [provider]  nebivolol (BYSTOLIC) 10 MG tablet Take 10 mg by mouth daily.    [provider]  Omega-3 Fatty Acids (FISH OIL) 1000 MG CAPS Take 1 capsule by mouth daily.    [provider]  rosuvastatin (CRESTOR) 20 MG tablet Take 20 mg by mouth daily.    [provider]  sertraline (ZOLOFT) 50 MG tablet Take 50 mg by mouth daily.    [provider]    Allergies  Allergen Reactions   Prednisone Swelling and Other (See Comments)    "felt like I couldn't breathe"    Family History  Problem Relation Age of Onset   Cancer Mother        lung cancer, died at age 25   Cancer Brother 32       lung cancer   Cancer Sister 36       breast cancer   Breast cancer Sister 40    Social History Social History   Tobacco Use   Smoking status: Former    Packs/day: 1.00    Years: 20.00    Pack years: 20.00    Types: Cigarettes   Smokeless tobacco: Never  Substance Use Topics   Alcohol use: No   Drug use: No    Review of Systems Constitutional: Positive for fever Cardiovascular: Negative for chest pain.  Respiratory: Positive for shortness of breath and cough since yesterday. Gastrointestinal: Negative for abdominal pain, vomiting.  Chronic diarrhea. Genitourinary: Negative for urinary compaints Musculoskeletal: Negative for musculoskeletal complaints Neurological: Negative for headache All other ROS negative  ____________________________________________   PHYSICAL EXAM:  VITAL SIGNS: ED Triage Vitals  Enc Vitals Group     BP 11/23/20 1232 136/72     Pulse Rate 11/23/20 1232 (!) 101     Resp 11/23/20 1232 20     Temp 11/23/20 1232 (!) 101.5 F (38.6 C)     Temp Source 11/23/20 1232 Oral     SpO2 11/23/20 1232 91 %     Weight --      Height --      Head Circumference --      Peak Flow --      Pain Score 11/23/20 1233 4     Pain Loc --      Pain Edu? --      Excl. in GC? --     Constitutional: Awake and alert well appearing and in no distress. Eyes: Normal exam ENT      Head: Normocephalic and atraumatic.      Mouth/Throat: Mucous membranes are moist. Cardiovascular: Normal rate, regular rhythm.  Respiratory: Normal respiratory effort without tachypnea nor retractions. Breath sounds are clear  Gastrointestinal: Soft and nontender. No distention.   Musculoskeletal: Nontender with normal range of motion in all extremities.  Neurologic:  Normal speech and language. No gross focal neurologic deficits  Skin:  Skin is warm, dry and intact.  Psychiatric: Mood and affect are normal.   ____________________________________________    EKG  EKG viewed and interpreted by myself shows a normal sinus rhythm at 99 bpm with a narrow QRS, normal axis, normal intervals, no concerning ST changes  ____________________________________________    RADIOLOGY  Chest x-ray is negative  ____________________________________________   INITIAL IMPRESSION / ASSESSMENT AND PLAN / ED COURSE  Pertinent labs & imaging results that were available during my care of the patient were reviewed by me and considered in my medical decision making (see chart for details).   Patient presents to the emergency department for shortness of breath and cough, fever and weakness.  Daughter states history of UTIs.  Patient's work-up thus far shows an elevated lactic acid of 2.1, normal white blood cell count.  Given the patient's initial tachycardia fever elevated lactate we will treat with antibiotics, IV fluids.  We will check labs including COVID and continue to closely monitor, urinalysis pending.  Given the patient's weakness inability to ambulate and fever anticipate likely admission to the hospital once the emergency department work-up is been completed.  Patient's work-up is most consistent with a urinary tract infection.  Given the patient's weakness and hypotension she received 30 mils per  kilo.  She will be admitted to the hospital service for further work-up treatment and antibiotics.  Olivia Martin was evaluated in Emergency Department on 11/23/2020 for the symptoms described in the history of present illness. She was evaluated in the context of the global COVID-19 pandemic, which necessitated consideration that the patient might be at risk for infection with the SARS-CoV-2 virus that causes COVID-19. Institutional protocols and algorithms that pertain to the evaluation of patients at risk for COVID-19 are in a state of rapid change based on information released by regulatory bodies including the CDC and federal and state organizations. These policies and algorithms were followed during the patient's care in the ED.  CRITICAL CARE  Performed by: Minna Antis   Total critical care time: 30 minutes  Critical care time was exclusive of separately billable procedures and treating other patients.  Critical care was necessary to treat or prevent imminent or life-threatening deterioration.  Critical care was time spent personally by me on the following activities: development of treatment plan with patient and/or surrogate as well as nursing, discussions with consultants, evaluation of patient's response to treatment, examination of patient, obtaining history from patient or surrogate, ordering and performing treatments and interventions, ordering and review of laboratory studies, ordering and review of radiographic studies, pulse oximetry and re-evaluation of patient's condition.  ____________________________________________   FINAL CLINICAL IMPRESSION(S) / ED DIAGNOSES  Sepsis UTI   Minna Antis, MD 11/23/20 2330

## 2020-11-23 NOTE — ED Triage Notes (Addendum)
FIRST NURSE NOTE: Pt to ED from [facility] for generalized weakness that started yesterday, dizziness that started this am and fevers., max 102.9  Daughter thinks has had a UTI

## 2020-11-23 NOTE — ED Triage Notes (Signed)
Pt to ED From [facility] for generalized weakness that started yesterday, dizziness today and fevers.  Pt c/o generalized abd pain Hx dementia

## 2020-11-23 NOTE — Sepsis Progress Note (Signed)
Notified provider of need to order additional  fluid bolus. 

## 2020-11-23 NOTE — Progress Notes (Signed)
CODE SEPSIS - PHARMACY COMMUNICATION  **Broad Spectrum Antibiotics should be administered within 1 hour of Sepsis diagnosis**  Time Code Sepsis Called/Page Received: @ 1542  Antibiotics Ordered: Vancomycin, Cefepime, Flagyl   Time of 1st antibiotic administration: 1613  Additional action taken by pharmacy: Messaged RN about adding weight to Epic @ 1546  If necessary, Name of Provider/Nurse Contacted:  Laren Boom, RN   Gardner Candle, PharmD, BCPS Clinical Pharmacist 11/23/2020 3:48 PM

## 2020-11-23 NOTE — Sepsis Progress Note (Signed)
Sepsis protocol is being monitored by eLink. 

## 2020-11-23 NOTE — Progress Notes (Signed)
ED Bedside RN Jill Alexanders aware of need for 2nd LA

## 2020-11-24 ENCOUNTER — Observation Stay: Payer: Medicare Other

## 2020-11-24 ENCOUNTER — Encounter: Payer: Self-pay | Admitting: Family Medicine

## 2020-11-24 ENCOUNTER — Other Ambulatory Visit: Payer: Self-pay

## 2020-11-24 DIAGNOSIS — Z515 Encounter for palliative care: Secondary | ICD-10-CM | POA: Diagnosis not present

## 2020-11-24 DIAGNOSIS — R059 Cough, unspecified: Secondary | ICD-10-CM | POA: Diagnosis present

## 2020-11-24 DIAGNOSIS — R3129 Other microscopic hematuria: Secondary | ICD-10-CM | POA: Diagnosis present

## 2020-11-24 DIAGNOSIS — Z7189 Other specified counseling: Secondary | ICD-10-CM | POA: Diagnosis not present

## 2020-11-24 DIAGNOSIS — I10 Essential (primary) hypertension: Secondary | ICD-10-CM | POA: Diagnosis present

## 2020-11-24 DIAGNOSIS — N390 Urinary tract infection, site not specified: Secondary | ICD-10-CM | POA: Diagnosis present

## 2020-11-24 DIAGNOSIS — Z79899 Other long term (current) drug therapy: Secondary | ICD-10-CM | POA: Diagnosis not present

## 2020-11-24 DIAGNOSIS — E876 Hypokalemia: Secondary | ICD-10-CM | POA: Diagnosis present

## 2020-11-24 DIAGNOSIS — Z28310 Unvaccinated for covid-19: Secondary | ICD-10-CM | POA: Diagnosis not present

## 2020-11-24 DIAGNOSIS — Z66 Do not resuscitate: Secondary | ICD-10-CM | POA: Diagnosis present

## 2020-11-24 DIAGNOSIS — D72819 Decreased white blood cell count, unspecified: Secondary | ICD-10-CM | POA: Diagnosis present

## 2020-11-24 DIAGNOSIS — Z87891 Personal history of nicotine dependence: Secondary | ICD-10-CM | POA: Diagnosis not present

## 2020-11-24 DIAGNOSIS — F0280 Dementia in other diseases classified elsewhere without behavioral disturbance: Secondary | ICD-10-CM | POA: Diagnosis present

## 2020-11-24 DIAGNOSIS — Z7982 Long term (current) use of aspirin: Secondary | ICD-10-CM | POA: Diagnosis not present

## 2020-11-24 DIAGNOSIS — G301 Alzheimer's disease with late onset: Secondary | ICD-10-CM | POA: Diagnosis not present

## 2020-11-24 DIAGNOSIS — Z801 Family history of malignant neoplasm of trachea, bronchus and lung: Secondary | ICD-10-CM | POA: Diagnosis not present

## 2020-11-24 DIAGNOSIS — Z20822 Contact with and (suspected) exposure to covid-19: Secondary | ICD-10-CM | POA: Diagnosis present

## 2020-11-24 DIAGNOSIS — Z96653 Presence of artificial knee joint, bilateral: Secondary | ICD-10-CM | POA: Diagnosis present

## 2020-11-24 DIAGNOSIS — F32A Depression, unspecified: Secondary | ICD-10-CM | POA: Diagnosis present

## 2020-11-24 DIAGNOSIS — G309 Alzheimer's disease, unspecified: Secondary | ICD-10-CM | POA: Diagnosis present

## 2020-11-24 DIAGNOSIS — M549 Dorsalgia, unspecified: Secondary | ICD-10-CM | POA: Diagnosis present

## 2020-11-24 DIAGNOSIS — G8929 Other chronic pain: Secondary | ICD-10-CM | POA: Diagnosis present

## 2020-11-24 DIAGNOSIS — Z87440 Personal history of urinary (tract) infections: Secondary | ICD-10-CM | POA: Diagnosis not present

## 2020-11-24 DIAGNOSIS — E785 Hyperlipidemia, unspecified: Secondary | ICD-10-CM | POA: Diagnosis present

## 2020-11-24 DIAGNOSIS — A419 Sepsis, unspecified organism: Secondary | ICD-10-CM | POA: Diagnosis present

## 2020-11-24 DIAGNOSIS — D696 Thrombocytopenia, unspecified: Secondary | ICD-10-CM | POA: Diagnosis present

## 2020-11-24 LAB — CBC WITH DIFFERENTIAL/PLATELET
Abs Immature Granulocytes: 0.01 10*3/uL (ref 0.00–0.07)
Basophils Absolute: 0 10*3/uL (ref 0.0–0.1)
Basophils Relative: 0 %
Eosinophils Absolute: 0 10*3/uL (ref 0.0–0.5)
Eosinophils Relative: 0 %
HCT: 36.2 % (ref 36.0–46.0)
Hemoglobin: 12.2 g/dL (ref 12.0–15.0)
Immature Granulocytes: 0 %
Lymphocytes Relative: 14 %
Lymphs Abs: 0.5 10*3/uL — ABNORMAL LOW (ref 0.7–4.0)
MCH: 33 pg (ref 26.0–34.0)
MCHC: 33.7 g/dL (ref 30.0–36.0)
MCV: 97.8 fL (ref 80.0–100.0)
Monocytes Absolute: 0.3 10*3/uL (ref 0.1–1.0)
Monocytes Relative: 8 %
Neutro Abs: 2.7 10*3/uL (ref 1.7–7.7)
Neutrophils Relative %: 78 %
Platelets: 70 10*3/uL — ABNORMAL LOW (ref 150–400)
RBC: 3.7 MIL/uL — ABNORMAL LOW (ref 3.87–5.11)
RDW: 12.7 % (ref 11.5–15.5)
WBC: 3.5 10*3/uL — ABNORMAL LOW (ref 4.0–10.5)
nRBC: 0 % (ref 0.0–0.2)

## 2020-11-24 LAB — BASIC METABOLIC PANEL
Anion gap: 9 (ref 5–15)
BUN: 12 mg/dL (ref 8–23)
CO2: 18 mmol/L — ABNORMAL LOW (ref 22–32)
Calcium: 7.7 mg/dL — ABNORMAL LOW (ref 8.9–10.3)
Chloride: 107 mmol/L (ref 98–111)
Creatinine, Ser: 0.75 mg/dL (ref 0.44–1.00)
GFR, Estimated: >60 mL/min (ref >60)
Glucose, Bld: 129 mg/dL — ABNORMAL HIGH (ref 70–99)
Potassium: 3.7 mmol/L (ref 3.5–5.1)
Sodium: 134 mmol/L — ABNORMAL LOW (ref 135–145)

## 2020-11-24 LAB — COMPREHENSIVE METABOLIC PANEL
ALT: 41 U/L (ref 0–44)
AST: 67 U/L — ABNORMAL HIGH (ref 15–41)
Albumin: 2.7 g/dL — ABNORMAL LOW (ref 3.5–5.0)
Alkaline Phosphatase: 31 U/L — ABNORMAL LOW (ref 38–126)
Anion gap: 8 (ref 5–15)
BUN: 12 mg/dL (ref 8–23)
CO2: 21 mmol/L — ABNORMAL LOW (ref 22–32)
Calcium: 7.3 mg/dL — ABNORMAL LOW (ref 8.9–10.3)
Chloride: 107 mmol/L (ref 98–111)
Creatinine, Ser: 0.61 mg/dL (ref 0.44–1.00)
GFR, Estimated: >60 mL/min (ref >60)
Glucose, Bld: 95 mg/dL (ref 70–99)
Potassium: 2.7 mmol/L — CL (ref 3.5–5.1)
Sodium: 136 mmol/L (ref 135–145)
Total Bilirubin: 0.6 mg/dL (ref 0.3–1.2)
Total Protein: 5.3 g/dL — ABNORMAL LOW (ref 6.5–8.1)

## 2020-11-24 MED ORDER — POTASSIUM CHLORIDE 10 MEQ/100ML IV SOLN
INTRAVENOUS | Status: AC
  Administered 2020-11-24: 10 meq via INTRAVENOUS
  Filled 2020-11-24: qty 100

## 2020-11-24 MED ORDER — POTASSIUM CHLORIDE CRYS ER 20 MEQ PO TBCR
40.0000 meq | EXTENDED_RELEASE_TABLET | ORAL | Status: AC
  Administered 2020-11-24 (×3): 40 meq via ORAL
  Filled 2020-11-24 (×3): qty 2

## 2020-11-24 NOTE — Progress Notes (Signed)
PROGRESS NOTE    Olivia Martin  OVF:[id number] DOB: December 10, 1940 DOA: 11/23/2020 PCP: [name], MD   Chief Complaint  Patient presents with   Weakness    Brief Narrative:   Olivia Martin is Olivia Martin 80 y.o. female with medical history significant for hypertension, Alzheimer's dementia, hyperlipidemia, history of bradycardia, presents to the emergency department from urgent care/walk-in clinic for chief concerns of lethargy.  She's been admitted for weakness, malaise, lethargy with UA findings concerning for UTI.   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   Depression   Alzheimer's dementia (HCC)  # Sepsis 2/2 UTI - sepsis due to fever, tachy, tachypnea and UA c/w UTI - s/p 3 L NS - continue ceftriaxone - blood and urine cultures pending  # Hypokalemia - severe, replace aggressively and follow - mag normal, follow - likely related to HCTZ    # Thrombocytopenia - likely 2/2 infection/sepsis above - trend  # History of hypertension - continue to hold hydrochlorothiazide, losartan, amlodipine given reasonable BP's today off these meds   # Hyperlipidemia-rosuvastatin 20 mg daily resumed   # Dementia, likely Alzheimer's-appears moderate to advanced - Resumed home donepezil 10 mg p.o. daily at bedtime - Memantine 10 mg p.o. twice daily  # on depakote, mood stabilizer? Will need to review with daughter   Chart reviewed.    Echo on 10/17/2020 was read as ejection fraction estimated greater than 55%, normal left ventricular systolic and normal right ventricular systolic function.  No valvular stenosis.  Trivial MR.  Mild TR.  DVT prophylaxis: SCD Code Status: DNR Family Communication: daughter at bedside Disposition:   Status is: Inpatient  Remains inpatient appropriate because:Inpatient  level of care appropriate due to severity of illness  Dispo: The patient is from:  home              Anticipated d/c is to:  pending              Patient currently is not medically stable to d/c.   Difficult to place patient No       Consultants:  none  Procedures:  none  Antimicrobials: Anti-infectives (From admission, onward)    Start     Dose/Rate Route Frequency Ordered Stop   11/24/20 0800  cefTRIAXone (ROCEPHIN) 1 g in sodium chloride 0.9 % 100 mL IVPB        1 g 200 mL/hr over 30 Minutes Intravenous Every 24 hours 11/23/20 1959     11/23/20 1700  vancomycin (VANCOCIN) IVPB 1000 mg/200 mL premix        1,000 mg 200 mL/hr over 60 Minutes Intravenous  Once 11/23/20 1600 11/24/20 0020   11/23/20 1545  ceFEPIme (MAXIPIME) 2 g in sodium chloride 0.9 % 100 mL IVPB        2 g 200 mL/hr over 30 Minutes Intravenous  Once 11/23/20 1542 11/23/20 1646   11/23/20 1545  metroNIDAZOLE (FLAGYL) IVPB 500 mg        500 mg 100 mL/hr over 60 Minutes Intravenous  Once 11/23/20 1542 11/23/20 1807   11/23/20 1545  vancomycin (VANCOCIN) IVPB 1000 mg/200 mL premix        1,000 mg 200 mL/hr over 60 Minutes Intravenous  Once 11/23/20 1542 11/23/20 2336  Subjective: Can't tell me why she's here - misnames daughter Daughter shares she came because of lethargy, weakness, fever  Objective: Vitals:   11/24/20 0631 11/24/20 0830 11/24/20 1155 11/24/20 1356  BP: 120/66 128/62 104/79   Pulse: 62 65 69   Resp: 20 (!) 23 18 18   Temp:      TempSrc: Oral     SpO2: 95% 94% 98% 97%  Weight:      Height:        Intake/Output Summary (Last 24 hours) at 11/24/2020 1421 Last data filed at 11/24/2020 0553 Gross per 24 hour  Intake 4522.33 ml  Output --  Net 4522.33 ml   Filed Weights   11/23/20 1548  Weight: 92.5 kg    Examination:  General exam: Appears calm and comfortable  Respiratory system: Clear to auscultation. Respiratory effort normal. Cardiovascular system: S1 & S2  heard, RRR.  Gastrointestinal system: Abdomen is nondistended, soft and nontender.  Central nervous system: Alert, but confused No focal neurological deficits. Extremities: no LEE Skin: No rashes, lesions or ulcers    Data Reviewed: I have personally reviewed following labs and imaging studies  CBC: Recent Labs  Lab 11/23/20 1236 11/24/20 1157  WBC 5.6 3.5*  NEUTROABS 4.7 2.7  HGB 14.9 12.2  HCT 43.7 36.2  MCV 95.0 97.8  PLT 114* 70*    Basic Metabolic Panel: Recent Labs  Lab 11/23/20 1236 11/24/20 1157  NA 138 136  K 2.9* 2.7*  CL 105 107  CO2 21* 21*  GLUCOSE 155* 95  BUN 21 12  CREATININE 0.85 0.61  CALCIUM 9.0 7.3*  MG 2.0  --     GFR: Estimated Creatinine Clearance: 64.4 mL/min (by C-G formula based on SCr of 0.61 mg/dL).  Liver Function Tests: Recent Labs  Lab 11/23/20 1236 11/24/20 1157  AST 99* 67*  ALT 56* 41  ALKPHOS 43 31*  BILITOT 0.9 0.6  PROT 7.5 5.3*  ALBUMIN 3.9 2.7*    CBG: No results for input(s): GLUCAP in the last 168 hours.   Recent Results (from the past 240 hour(s))  Culture, blood (Routine x 2)     Status: None (Preliminary result)   Collection Time: 11/23/20 12:36 PM   Specimen: BLOOD  Result Value Ref Range Status   Specimen Description BLOOD BLOOD RIGHT HAND  Final   Special Requests   Final    BOTTLES DRAWN AEROBIC ONLY Blood Culture results may not be optimal due to an inadequate volume of blood received in culture bottles   Culture   Final    NO GROWTH < 24 HOURS Performed at [facility], [address] [place], [place] [address]    Report Status PENDING  Incomplete  Culture, blood (Routine x 2)     Status: None (Preliminary result)   Collection Time: 11/23/20 12:44 PM   Specimen: BLOOD  Result Value Ref Range Status   Specimen Description BLOOD BLOOD RIGHT ARM  Final   Special Requests   Final    BOTTLES DRAWN AEROBIC AND ANAEROBIC Blood Culture adequate volume   Culture   Final    NO GROWTH <  24 HOURS Performed at [facility], [address] [place], [place] [address]    Report Status PENDING  Incomplete  Resp Panel by RT-PCR (Flu Keaun Schnabel&B, Covid)     Status: None   Collection Time: 11/23/20  6:22 PM   Specimen: Nasopharyngeal(NP) swabs in vial transport medium  Result Value Ref Range Status  SARS Coronavirus 2 by RT PCR NEGATIVE NEGATIVE Final    Comment: (NOTE) SARS-CoV-2 target nucleic acids are NOT DETECTED.  The SARS-CoV-2 RNA is generally detectable in upper respiratory specimens during the acute phase of infection. The lowest concentration of SARS-CoV-2 viral copies this assay can detect is 138 copies/mL. Lisel Siegrist negative result does not preclude SARS-Cov-2 infection and should not be used as the sole basis for treatment or other patient management decisions. Priscila Bean negative result may occur with  improper specimen collection/handling, submission of specimen other than nasopharyngeal swab, presence of viral mutation(s) within the areas targeted by this assay, and inadequate number of viral copies(<138 copies/mL). Thedora Rings negative result must be combined with clinical observations, patient history, and epidemiological information. The expected result is Negative.  Fact Sheet for Patients:  BloggerCourse.com  Fact Sheet for Healthcare Providers:  SeriousBroker.it  This test is no t yet approved or cleared by the [place] FDA and  has been authorized for detection and/or diagnosis of SARS-CoV-2 by FDA under an Emergency Use Authorization (EUA). This EUA will remain  in effect (meaning this test can be used) for the duration of the COVID-19 declaration under Section 564(b)(1) of the Act, 21 U.S.C.section 360bbb-3(b)(1), unless the authorization is terminated  or revoked sooner.       Influenza Shamina Etheridge by PCR NEGATIVE NEGATIVE Final   Influenza B by PCR NEGATIVE NEGATIVE Final    Comment: (NOTE) The Xpert Xpress  SARS-CoV-2/FLU/RSV plus assay is intended as an aid in the diagnosis of influenza from Nasopharyngeal swab specimens and should not be used as Trystian Crisanto sole basis for treatment. Nasal washings and aspirates are unacceptable for Xpert Xpress SARS-CoV-2/FLU/RSV testing.  Fact Sheet for Patients: BloggerCourse.com  Fact Sheet for Healthcare Providers: SeriousBroker.it  This test is not yet approved or cleared by the [place] FDA and has been authorized for detection and/or diagnosis of SARS-CoV-2 by FDA under an Emergency Use Authorization (EUA). This EUA will remain in effect (meaning this test can be used) for the duration of the COVID-19 declaration under Section 564(b)(1) of the Act, 21 U.S.C. section 360bbb-3(b)(1), unless the authorization is terminated or revoked.  Performed at [facility], [address] [place], [place] [address]          Radiology Studies: DG Chest 2 View  Result Date: 11/23/2020 CLINICAL DATA:  Suspected sepsis. EXAM: CHEST - 2 VIEW COMPARISON:  07/13/2007 FINDINGS: Stable elevation of the RIGHT hemidiaphragm. Heart size is normal. The lungs are free of focal consolidations and pleural effusions. No pulmonary edema. Moderate thoracic degenerative changes. IMPRESSION: No evidence for acute  abnormality. Electronically Signed   By: Norva Pavlov M.D.   On: 11/23/2020 13:23   DG Chest Port 1 View  Result Date: 11/24/2020 CLINICAL DATA:  Cough and generalized weakness. EXAM: PORTABLE CHEST 1 VIEW COMPARISON:  11/23/2020 FINDINGS: Slightly coarse lung markings are unchanged. No focal lung disease. Slightly prominent central vascular structures are stable. Heart size is within normal limits. Negative for Amarrah Meinhart pneumothorax. No acute bone abnormality. IMPRESSION: No acute chest findings. Electronically Signed   By: Richarda Overlie M.D.   On: 11/24/2020 09:22        Scheduled Meds:  aspirin EC  81 mg Oral  Daily   divalproex  125 mg Oral BID   donepezil  10 mg Oral QHS   enoxaparin (LOVENOX) injection  40 mg Subcutaneous Q24H   memantine  10 mg Oral BID   pantoprazole  40 mg Oral q AM  potassium chloride  40 mEq Oral Q4H   rosuvastatin  20 mg Oral QHS   Continuous Infusions:  cefTRIAXone (ROCEPHIN)  IV Stopped (11/24/20 0852)     LOS: 0 days    Time spent: over 30 min    Lacretia Nicks, MD [facility]   To contact the attending provider between 7A-7P or the covering provider during after hours 7P-7A, please log into the web site [email or web address] and access using universal [place] password for that web site. If you do not have the password, please call the hospital operator.  11/24/2020, 2:21 PM

## 2020-11-24 NOTE — Evaluation (Addendum)
Physical Therapy Evaluation Patient Details Name: Olivia Martin MRN: [id number] DOB: 06/04/1940 Today's Date: 11/24/2020   History of Present Illness  Pt is an 80 y/o F admitted on 11/23/20 with c/c of lethargy & confusion. Pt being treated for sepsis 2/2 UTI. PMH: HTN, alzheimer's dementia, HLD, hx of bradycardia  Clinical Impression  Pt seen for PT evaluation with daughter present & providing PLOF/home set up information. Pt has been residing in the house behind her daughter with daughter frequently checking on pt & providing physical assistance prior to admission. On this date, pt is only able to state her name & her daughter's name, not oriented to anything else. Pt requires mod assist +2 for supine>sit, min assist sit>supine and sit<>stand. Pt requires dependent assist for peri hygiene 2/2 bowel incontinence. Pt is able to ambulate in room with RW but requires min assist & MAX cuing 2/2 decreased awareness & ability to sequence turning. Pt would benefit from acute PT services to address strength, endurance, & gait. Pt will require 24 HR assistance upon d/c, which daughter says she can provide.  Addendum: MD cleared pt for participation in setting of low K+.    Follow Up Recommendations Home health PT;Supervision/Assistance - 24 hour    Equipment Recommendations  3in1 (PT)    Recommendations for Other Services       Precautions / Restrictions Precautions Precautions: Fall Restrictions Weight Bearing Restrictions: No      Mobility  Bed Mobility Overal bed mobility: Needs Assistance Bed Mobility: Supine to Sit;Sit to Supine     Supine to sit: Mod assist;HOB elevated;+2 for physical assistance Sit to supine: Min assist;HOB elevated   General bed mobility comments: Pt requires assistance to upright trunk    Transfers Overall transfer level: Needs assistance Equipment used: Rolling walker (2 wheeled) Transfers: Sit to/from Stand Sit to Stand: Min assist;From elevated  surface         General transfer comment: poor awareness of safe hand placement  Ambulation/Gait Ambulation/Gait assistance: Min assist Gait Distance (Feet): 20 Feet Assistive device: Rolling walker (2 wheeled) Gait Pattern/deviations: Decreased step length - left;Decreased step length - right;Decreased stride length Gait velocity: decreased   General Gait Details: forward trunk  Stairs            Wheelchair Mobility    Modified Rankin (Stroke Patients Only)       Balance Overall balance assessment: Needs assistance Sitting-balance support: Feet supported;Bilateral upper extremity supported Sitting balance-Leahy Scale: Fair       Standing balance-Leahy Scale: Fair                               Pertinent Vitals/Pain Pain Assessment: No/denies pain    Home Living Family/patient expects to be discharged to:: Private residence Living Arrangements: Alone Available Help at Discharge: Family;Available 24 hours/day Type of Home: House Home Access: Stairs to enter Entrance Stairs-Rails: Right;Left;Can reach both Entrance Stairs-Number of Steps: 5 Home Layout: One level Home Equipment: Walker - 2 wheels      Prior Function Level of Independence: Needs assistance         Comments: Daughter provides PLOF information, reporting pt required assistance to get OOB, was incontinent & used brief with family providing total assist for peri hygiene, bathing & dressing, was able to complete transfers & gait in house with RW without assistance. Daughter reports pt lives alone in her house behind daughter, but they check on her at  least every 2 hours but can provide 24 hr assist when pt does need it.     Hand Dominance        Extremity/Trunk Assessment   Upper Extremity Assessment Upper Extremity Assessment: Generalized weakness    Lower Extremity Assessment Lower Extremity Assessment: Generalized weakness    Cervical / Trunk Assessment Cervical /  Trunk Assessment: Kyphotic  Communication   Communication: HOH  Cognition Arousal/Alertness: Awake/alert Behavior During Therapy: Flat affect Overall Cognitive Status: History of cognitive impairments - at baseline                                 General Comments: Pt with hx of dementia. On this date pt only oriented to herself but name only. Pt unable to recall her birthday or current age. Pt not oriented to location, situation, nor time. Is able to recognize her daughter in the room.      General Comments General comments (skin integrity, edema, etc.): Pt incontinent of BM & unaware, requires dependent assist for peri hygiene and changing into clean brief.    Exercises     Assessment/Plan    PT Assessment Patient needs continued PT services  PT Problem List Decreased strength;Decreased mobility;Decreased safety awareness;Decreased activity tolerance;Decreased balance;Decreased knowledge of use of DME;Cardiopulmonary status limiting activity;Decreased cognition       PT Treatment Interventions DME instruction;Therapeutic exercise;Gait training;Balance training;Therapeutic activities;Patient/family education;Functional mobility training;Neuromuscular re-education;Cognitive remediation    PT Goals (Current goals can be found in the Care Plan section)  Acute Rehab PT Goals PT Goal Formulation: Patient unable to participate in goal setting Time For Goal Achievement: 12/08/20    Frequency Min 2X/week   Barriers to discharge        Co-evaluation               AM-PAC PT "6 Clicks" Mobility  Outcome Measure Help needed turning from your back to your side while in a flat bed without using bedrails?: A Lot Help needed moving from lying on your back to sitting on the side of a flat bed without using bedrails?: Total Help needed moving to and from a bed to a chair (including a wheelchair)?: A Lot Help needed standing up from a chair using your arms (e.g.,  wheelchair or bedside chair)?: A Lot Help needed to walk in hospital room?: A Lot Help needed climbing 3-5 steps with a railing? : Total 6 Click Score: 10    End of Session Equipment Utilized During Treatment: Gait belt Activity Tolerance: Patient tolerated treatment well Patient left: in bed;with call bell/phone within reach;with family/visitor present Nurse Communication: Mobility status PT Visit Diagnosis: Unsteadiness on feet (R26.81);Muscle weakness (generalized) (M62.81);Difficulty in walking, not elsewhere classified (R26.2)    Time: 4401-0272 PT Time Calculation (min) (ACUTE ONLY): 21 min   Charges:   PT Evaluation $PT Eval Low Complexity: 1 Low PT Treatments $Therapeutic Activity: 8-22 mins        Aleda Grana, PT, DPT 11/24/20, 12:46 PM   Olivia Martin 11/24/2020, 12:42 PM

## 2020-11-24 NOTE — ED Notes (Signed)
Daughter assisting pt in eating her breakfast

## 2020-11-24 NOTE — ED Notes (Signed)
Introduced self to pt. Pt's daughter at bedside. Pt disoriented but very pleasant. Denies further needs at this time.

## 2020-11-24 NOTE — ED Notes (Signed)
SCD's not placed at this time due to no SCD's available in the ED/ED beds.

## 2020-11-24 NOTE — ED Notes (Signed)
Pt had BM. Changed, new gown, clean blankets applied. Pt given new purewick.

## 2020-11-24 NOTE — ED Notes (Signed)
MD Lowell Guitar, notified of potassium of 2.7

## 2020-11-24 NOTE — ED Notes (Signed)
Minette Brine Daughter [phone] Call Daughter with updates or issues

## 2020-11-24 NOTE — ED Notes (Signed)
Pt assisted to stand in order to clean from a second BM. PT at bedside. New brief and sheets.

## 2020-11-24 NOTE — ED Notes (Signed)
Pt cleaned of urine, new brief applied.

## 2020-11-25 ENCOUNTER — Inpatient Hospital Stay: Payer: Medicare Other

## 2020-11-25 LAB — CBC WITH DIFFERENTIAL/PLATELET
Abs Immature Granulocytes: 0.02 10*3/uL (ref 0.00–0.07)
Basophils Absolute: 0 10*3/uL (ref 0.0–0.1)
Basophils Relative: 1 %
Eosinophils Absolute: 0 10*3/uL (ref 0.0–0.5)
Eosinophils Relative: 0 %
HCT: 36.5 % (ref 36.0–46.0)
Hemoglobin: 12.8 g/dL (ref 12.0–15.0)
Immature Granulocytes: 1 %
Lymphocytes Relative: 23 %
Lymphs Abs: 0.6 10*3/uL — ABNORMAL LOW (ref 0.7–4.0)
MCH: 33.1 pg (ref 26.0–34.0)
MCHC: 35.1 g/dL (ref 30.0–36.0)
MCV: 94.3 fL (ref 80.0–100.0)
Monocytes Absolute: 0.2 10*3/uL (ref 0.1–1.0)
Monocytes Relative: 9 %
Neutro Abs: 1.7 10*3/uL (ref 1.7–7.7)
Neutrophils Relative %: 66 %
Platelets: 64 10*3/uL — ABNORMAL LOW (ref 150–400)
RBC: 3.87 MIL/uL (ref 3.87–5.11)
RDW: 12.9 % (ref 11.5–15.5)
Smear Review: NORMAL
WBC: 2.6 10*3/uL — ABNORMAL LOW (ref 4.0–10.5)
nRBC: 0 % (ref 0.0–0.2)

## 2020-11-25 LAB — COMPREHENSIVE METABOLIC PANEL
ALT: 38 U/L (ref 0–44)
AST: 59 U/L — ABNORMAL HIGH (ref 15–41)
Albumin: 2.8 g/dL — ABNORMAL LOW (ref 3.5–5.0)
Alkaline Phosphatase: 32 U/L — ABNORMAL LOW (ref 38–126)
Anion gap: 5 (ref 5–15)
BUN: 10 mg/dL (ref 8–23)
CO2: 24 mmol/L (ref 22–32)
Calcium: 7.9 mg/dL — ABNORMAL LOW (ref 8.9–10.3)
Chloride: 104 mmol/L (ref 98–111)
Creatinine, Ser: 0.7 mg/dL (ref 0.44–1.00)
GFR, Estimated: >60 mL/min (ref >60)
Glucose, Bld: 96 mg/dL (ref 70–99)
Potassium: 3.8 mmol/L (ref 3.5–5.1)
Sodium: 133 mmol/L — ABNORMAL LOW (ref 135–145)
Total Bilirubin: 0.6 mg/dL (ref 0.3–1.2)
Total Protein: 5.5 g/dL — ABNORMAL LOW (ref 6.5–8.1)

## 2020-11-25 LAB — MAGNESIUM: Magnesium: 1.6 mg/dL — ABNORMAL LOW (ref 1.7–2.4)

## 2020-11-25 LAB — PHOSPHORUS: Phosphorus: 1.8 mg/dL — ABNORMAL LOW (ref 2.5–4.6)

## 2020-11-25 LAB — URINE CULTURE

## 2020-11-25 LAB — VITAMIN B12: Vitamin B-12: 476 pg/mL (ref 180–914)

## 2020-11-25 MED ORDER — K PHOS MONO-SOD PHOS DI & MONO 155-852-130 MG PO TABS
500.0000 mg | ORAL_TABLET | Freq: Three times a day (TID) | ORAL | Status: AC
  Administered 2020-11-25 (×4): 500 mg via ORAL
  Filled 2020-11-25 (×4): qty 2

## 2020-11-25 MED ORDER — HALOPERIDOL LACTATE 5 MG/ML IJ SOLN
1.0000 mg | Freq: Four times a day (QID) | INTRAMUSCULAR | Status: DC | PRN
  Administered 2020-11-27: 1 mg via INTRAMUSCULAR
  Filled 2020-11-25: qty 1

## 2020-11-25 MED ORDER — MAGNESIUM SULFATE 2 GM/50ML IV SOLN
2.0000 g | Freq: Once | INTRAVENOUS | Status: AC
  Administered 2020-11-25: 2 g via INTRAVENOUS
  Filled 2020-11-25: qty 50

## 2020-11-25 MED ORDER — TRAZODONE HCL 50 MG PO TABS
25.0000 mg | ORAL_TABLET | Freq: Every evening | ORAL | Status: DC | PRN
Start: 2020-11-25 — End: 2020-11-27
  Administered 2020-11-25 – 2020-11-26 (×3): 25 mg via ORAL
  Filled 2020-11-25 (×3): qty 1

## 2020-11-25 NOTE — TOC Initial Note (Signed)
Transition of Care [facility]) - Initial/Assessment Note    Patient Details  Name: Olivia Martin MRN: [id number] Date of Birth: 1940-07-25  Transition of Care [facility]) CM/SW Contact:    Maree Krabbe, LCSW Phone Number: 11/25/2020, 12:32 PM  Clinical Narrative:   Pt's sister present at bedside. Pt's sister is agreeable to [facility] and at this time [facility] services. Pt's sister agreeable.                 Expected Discharge Plan: Home w Home Health Services Barriers to Discharge: Continued Medical Work up   Patient Goals and CMS Choice     Choice offered to / list presented to : Sibling  Expected Discharge Plan and Services Expected Discharge Plan: Home w Home Health Services In-house Referral: Clinical Social Work   Post Acute Care Choice: Home Health Living arrangements for the past 2 months: Single Family Home                           HH Arranged: PT, OT, RN HH [facility]: [name] Date HH Agency Contacted: 11/25/20 Time HH Agency Contacted: 1231 Representative spoke with at [facility] Agency: Barbara Cower  Prior Living Arrangements/Services Living arrangements for the past 2 months: Single Family Home Lives with:: Self Patient language and need for interpreter reviewed:: Yes Do you feel safe going back to the place where you live?: Yes      Need for Family Participation in Patient Care: Yes (Comment) Care giver support system in place?: Yes (comment)   Criminal Activity/Legal Involvement Pertinent to Current Situation/Hospitalization: No - Comment as needed  Activities of Daily Living Home Assistive Devices/Equipment: Grab bars in shower, Raised toilet seat with rails, Walker (specify type), Wheelchair, Eyeglasses, Blood pressure cuff ADL Screening (condition at time of admission) Patient's cognitive ability adequate to safely complete daily activities?: No Is the patient deaf or have difficulty hearing?: No Does the patient have difficulty seeing, even when  wearing glasses/contacts?: No Does the patient have difficulty concentrating, remembering, or making decisions?: Yes Patient able to express need for assistance with ADLs?: No Does the patient have difficulty dressing or bathing?: No Independently performs ADLs?: No Communication: Independent Dressing (OT): Needs assistance Is this a change from baseline?: Pre-admission baseline Grooming: Needs assistance Is this a change from baseline?: Pre-admission baseline Feeding: Independent with device (comment) Bathing: Needs assistance Is this a change from baseline?: Pre-admission baseline Toileting: Needs assistance Is this a change from baseline?: Pre-admission baseline In/Out Bed: Needs assistance Is this a change from baseline?: Pre-admission baseline Walks in Home: Needs assistance Is this a change from baseline?: Pre-admission baseline Does the patient have difficulty walking or climbing stairs?: Yes Weakness of Legs: Both Weakness of Arms/Hands: Both  Permission Sought/Granted Permission sought to share information with : Family Supports          Permission granted to share info w Relationship: sister     Emotional Assessment Appearance:: Appears stated age Attitude/Demeanor/Rapport: Unable to Assess Affect (typically observed): Unable to Assess Orientation: : Oriented to Self Alcohol / Substance Use: Not Applicable Psych Involvement: No (comment)  Admission diagnosis:  Cough [R05.9] UTI (urinary tract infection) [N39.0] Urinary tract infection without hematuria, site unspecified [N39.0] Sepsis, due to unspecified organism, unspecified whether acute organ dysfunction present [facility]) [A41.9] Patient Active Problem List   Diagnosis Date Noted   UTI (urinary tract infection) 11/23/2020   Essential hypertension 11/23/2020   Hyperlipidemia 11/23/2020   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 [place] [address] Phone: [phone] Fax: [phone]  [facility] - [place], [place] - [address] [place] [address] Phone: [phone] Fax: [phone]     Social Determinants of Health (SDOH) Interventions    Readmission Risk Interventions No flowsheet data found.

## 2020-11-25 NOTE — Progress Notes (Signed)
PROGRESS NOTE    Olivia Martin  HBZ:[id number] DOB: 1941/01/20 DOA: 11/23/2020 PCP: [name], MD   Chief Complaint  Patient presents with   Weakness    Brief Narrative:   [name] is Olivia Martin 80 y.o. female with medical history significant for hypertension, Alzheimer's dementia, hyperlipidemia, history of bradycardia, presents to the emergency department from urgent care/walk-in clinic for chief concerns of lethargy.  She's been admitted for weakness, malaise, lethargy with UA findings concerning for UTI.   Assessment & Plan:   Principal Problem:   Sepsis secondary to UTI [facility]) Active Problems:   UTI (urinary tract infection)   Essential hypertension   Hyperlipidemia   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   Depression   Alzheimer's dementia (HCC)  # Sepsis 2/2 UTI - sepsis due to fever, tachy, tachypnea and UA c/w UTI - s/p 3 L NS - continue ceftriaxone - blood cx with no growth and urine cultures with multiple species - despite negative urine culture, given presentation with fever, UA with positive nitrite/RBC's/WBC's, thrombocytopenia, will complete therapy for UTI  # Abnormal UA  Microscopic Hematuria  - suspected due to UTI - follow repeat and outpatient  # Hypokalemia  hypomagnesemia - severe, replace aggressively and follow   # Thrombocytopenia  Leukopenia - worsening today, follow  # History of hypertension - continue to hold hydrochlorothiazide, losartan, amlodipine given reasonable BP's today off these meds - stable   # Hyperlipidemia-rosuvastatin 20 mg daily resumed   # Dementia, likely Alzheimer's-appears moderate to advanced - Resumed home donepezil 10 mg p.o. daily at bedtime - Memantine 10 mg p.o. twice daily  # on depakote, mood stabilizer? Will need to review with daughter   Chart reviewed.    Echo on 10/17/2020 was read as ejection fraction estimated greater than 55%, normal left ventricular systolic and normal right  ventricular systolic function.  No valvular stenosis.  Trivial MR.  Mild TR.  DVT prophylaxis: SCD Code Status: DNR Family Communication: sister at bedside Disposition:   Status is: Inpatient  Remains inpatient appropriate because:Inpatient level of care appropriate due to severity of illness  Dispo: The patient is from:  home              Anticipated d/c is to:  pending              Patient currently is not medically stable to d/c.   Difficult to place patient No       Consultants:  none  Procedures:  none  Antimicrobials: Anti-infectives (From admission, onward)    Start     Dose/Rate Route Frequency Ordered Stop   11/24/20 0800  cefTRIAXone (ROCEPHIN) 1 g in sodium chloride 0.9 % 100 mL IVPB        1 g 200 mL/hr over 30 Minutes Intravenous Every 24 hours 11/23/20 1959     11/23/20 1700  vancomycin (VANCOCIN) IVPB 1000 mg/200 mL premix        1,000 mg 200 mL/hr over 60 Minutes Intravenous  Once 11/23/20 1600 11/24/20 0020   11/23/20 1545  ceFEPIme (MAXIPIME) 2 g in sodium chloride 0.9 % 100 mL IVPB        2 g 200 mL/hr over 30 Minutes Intravenous  Once 11/23/20 1542 11/23/20 1646   11/23/20 1545  metroNIDAZOLE (FLAGYL) IVPB 500 mg        500 mg 100 mL/hr over 60 Minutes Intravenous  Once 11/23/20 1542 11/23/20 1807   11/23/20 1545  vancomycin (  VANCOCIN) IVPB 1000 mg/200 mL premix        1,000 mg 200 mL/hr over 60 Minutes Intravenous  Once 11/23/20 1542 11/23/20 2336          Subjective: Confused, occasionally says aggressive things like she ought to smack me (sister notes she says this to her occasionally at home as well)  Objective: Vitals:   11/24/20 1621 11/24/20 2028 11/25/20 0451 11/25/20 0736  BP: 110/69 131/61 139/60 131/61  Pulse: 71 75 76 73  Resp: 18 18 18 16   Temp: 98.5 F (36.9 C) 98.4 F (36.9 C) 98.6 F (37 C) 99.7 F (37.6 C)  TempSrc: Oral     SpO2: 98% 95% 95% 93%  Weight:      Height:        Intake/Output Summary (Last 24  hours) at 11/25/2020 1430 Last data filed at 11/24/2020 1800 Gross per 24 hour  Intake 103.33 ml  Output --  Net 103.33 ml   Filed Weights   11/23/20 1548  Weight: 92.5 kg    Examination:  General: No acute distress. Cardiovascular: Heart sounds show Alphonsa Brickle regular rate, and rhythm. Lungs: Clear to auscultation bilaterally . Abdomen: Soft, nontender, nondistended  Neurological: Alert, but confused - can't name place or month. Moves all extremities 4 . Cranial nerves II through XII intact. Skin: Warm and dry. No rashes or lesions. Extremities: No clubbing or cyanosis. No edema.      Data Reviewed: I have personally reviewed following labs and imaging studies  CBC: Recent Labs  Lab 11/23/20 1236 11/24/20 1157 11/25/20 0606  WBC 5.6 3.5* 2.6*  NEUTROABS 4.7 2.7 1.7  HGB 14.9 12.2 12.8  HCT 43.7 36.2 36.5  MCV 95.0 97.8 94.3  PLT 114* 70* 64*    Basic Metabolic Panel: Recent Labs  Lab 11/23/20 1236 11/24/20 1157 11/24/20 1954 11/25/20 0606  NA 138 136 134* 133*  K 2.9* 2.7* 3.7 3.8  CL 105 107 107 104  CO2 21* 21* 18* 24  GLUCOSE 155* 95 129* 96  BUN 21 12 12 10   CREATININE 0.85 0.61 0.75 0.70  CALCIUM 9.0 7.3* 7.7* 7.9*  MG 2.0  --   --  1.6*  PHOS  --   --   --  1.8*    GFR: Estimated Creatinine Clearance: 64.4 mL/min (by C-G formula based on SCr of 0.7 mg/dL).  Liver Function Tests: Recent Labs  Lab 11/23/20 1236 11/24/20 1157 11/25/20 0606  AST 99* 67* 59*  ALT 56* 41 38  ALKPHOS 43 31* 32*  BILITOT 0.9 0.6 0.6  PROT 7.5 5.3* 5.5*  ALBUMIN 3.9 2.7* 2.8*    CBG: No results for input(s): GLUCAP in the last 168 hours.   Recent Results (from the past 240 hour(s))  Culture, blood (Routine x 2)     Status: None (Preliminary result)   Collection Time: 11/23/20 12:36 PM   Specimen: BLOOD  Result Value Ref Range Status   Specimen Description BLOOD BLOOD RIGHT HAND  Final   Special Requests   Final    BOTTLES DRAWN AEROBIC ONLY Blood Culture  results may not be optimal due to an inadequate volume of blood received in culture bottles   Culture   Final    NO GROWTH 2 DAYS Performed at [facility], [address] [place], [address] [place]    Report Status PENDING  Incomplete  Culture, blood (Routine x 2)     Status: None (Preliminary result)   Collection Time: 11/23/20  12:44 PM   Specimen: BLOOD  Result Value Ref Range Status   Specimen Description BLOOD BLOOD RIGHT ARM  Final   Special Requests   Final    BOTTLES DRAWN AEROBIC AND ANAEROBIC Blood Culture adequate volume   Culture   Final    NO GROWTH 2 DAYS Performed at [facility], [address] [place], [place] [address]    Report Status PENDING  Incomplete  Resp Panel by RT-PCR (Flu Sonal Dorwart&B, Covid)     Status: None   Collection Time: 11/23/20  6:22 PM   Specimen: Nasopharyngeal(NP) swabs in vial transport medium  Result Value Ref Range Status   SARS Coronavirus 2 by RT PCR NEGATIVE NEGATIVE Final    Comment: (NOTE) SARS-CoV-2 target nucleic acids are NOT DETECTED.  The SARS-CoV-2 RNA is generally detectable in upper respiratory specimens during the acute phase of infection. The lowest concentration of SARS-CoV-2 viral copies this assay can detect is 138 copies/mL. Maeve Debord negative result does not preclude SARS-Cov-2 infection and should not be used as the sole basis for treatment or other patient management decisions. Waverly Chavarria negative result may occur with  improper specimen collection/handling, submission of specimen other than nasopharyngeal swab, presence of viral mutation(s) within the areas targeted by this assay, and inadequate number of viral copies(<138 copies/mL). Saul Fabiano negative result must be combined with clinical observations, patient history, and epidemiological information. The expected result is Negative.  Fact Sheet for Patients:  BloggerCourse.com  Fact Sheet for Healthcare Providers:   SeriousBroker.it  This test is no t yet approved or cleared by the [place] FDA and  has been authorized for detection and/or diagnosis of SARS-CoV-2 by FDA under an Emergency Use Authorization (EUA). This EUA will remain  in effect (meaning this test can be used) for the duration of the COVID-19 declaration under Section 564(b)(1) of the Act, 21 U.S.C.section 360bbb-3(b)(1), unless the authorization is terminated  or revoked sooner.       Influenza Jett Fukuda by PCR NEGATIVE NEGATIVE Final   Influenza B by PCR NEGATIVE NEGATIVE Final    Comment: (NOTE) The Xpert Xpress SARS-CoV-2/FLU/RSV plus assay is intended as an aid in the diagnosis of influenza from Nasopharyngeal swab specimens and should not be used as Soma Lizak sole basis for treatment. Nasal washings and aspirates are unacceptable for Xpert Xpress SARS-CoV-2/FLU/RSV testing.  Fact Sheet for Patients: BloggerCourse.com  Fact Sheet for Healthcare Providers: SeriousBroker.it  This test is not yet approved or cleared by the [place] FDA and has been authorized for detection and/or diagnosis of SARS-CoV-2 by FDA under an Emergency Use Authorization (EUA). This EUA will remain in effect (meaning this test can be used) for the duration of the COVID-19 declaration under Section 564(b)(1) of the Act, 21 U.S.C. section 360bbb-3(b)(1), unless the authorization is terminated or revoked.  Performed at [facility], [address], [place], [place] [address]   Urine culture     Status: Abnormal   Collection Time: 11/23/20  6:22 PM   Specimen: Urine, Random  Result Value Ref Range Status   Specimen Description   Final    URINE, RANDOM Performed at [facility], [address], [place], [place] [address]    Special Requests   Final    NONE Performed at [facility], [address] [place], [place] [address]    Culture  MULTIPLE SPECIES PRESENT, SUGGEST RECOLLECTION (Rochelle Nephew)  Final   Report Status 11/25/2020 FINAL  Final  Radiology Studies: US RENAL  Result Date: 11/25/2020 CLINICAL DATA:  Back pain and urinary tract infection. EXAM: RENAL / URINARY TRACT ULTRASOUND COMPLETE COMPARISON:  CT of the abdomen and pelvis on 11/13/2008 FINDINGS: Right Kidney: Renal measurements: 10.3 x 5.2 x 4.9 centimeters = volume: 138.5 mL. Echogenicity within normal limits. No mass or hydronephrosis visualized. Left Kidney: Renal measurements: 10.9 x 5.7 x 5.5 centimeters = volume: 180.7 mL. Echogenicity is normal. No hydronephrosis. Small parapelvic cyst is 2.1 x 1.8 x 1.3 centimeters. Note is made of focal scarring in the LOWER pole region, similar in appearance to prior CT exam. Bladder: Appears normal for degree of bladder distention. Other: Study quality is degraded by limited ability of the patient to breath hold. IMPRESSION: 1. No hydronephrosis. 2. No suspicious renal lesions. Electronically Signed   By: Norva PavlovElizabeth  Brown M.D.   On: 11/25/2020 12:43   DG Chest Port 1 View  Result Date: 11/24/2020 CLINICAL DATA:  Cough and generalized weakness. EXAM: PORTABLE CHEST 1 VIEW COMPARISON:  11/23/2020 FINDINGS: Slightly coarse lung markings are unchanged. No focal lung disease. Slightly prominent central vascular structures are stable. Heart size is within normal limits. Negative for Neoma Uhrich pneumothorax. No acute bone abnormality. IMPRESSION: No acute chest findings. Electronically Signed   By: Richarda OverlieAdam  Henn M.D.   On: 11/24/2020 09:22        Scheduled Meds:  divalproex  125 mg Oral BID   donepezil  10 mg Oral QHS   memantine  10 mg Oral BID   pantoprazole  40 mg Oral q AM   phosphorus  500 mg Oral TID WC & HS   rosuvastatin  20 mg Oral QHS   Continuous Infusions:  cefTRIAXone (ROCEPHIN)  IV 1 g (11/25/20 0833)     LOS: 1 day    Time spent: over 30 min    Lacretia Nicksaldwell Powell, MD [facility]   To contact the  attending provider between 7A-7P or the covering provider during after hours 7P-7A, please log into the web site [email or web address] and access using universal [place] password for that web site. If you do not have the password, please call the hospital operator.  11/25/2020, 2:30 PM

## 2020-11-26 DIAGNOSIS — I10 Essential (primary) hypertension: Secondary | ICD-10-CM

## 2020-11-26 LAB — CBC WITH DIFFERENTIAL/PLATELET
Abs Immature Granulocytes: 0.02 10*3/uL (ref 0.00–0.07)
Basophils Absolute: 0.1 10*3/uL (ref 0.0–0.1)
Basophils Relative: 2 %
Eosinophils Absolute: 0 10*3/uL (ref 0.0–0.5)
Eosinophils Relative: 0 %
HCT: 42 % (ref 36.0–46.0)
Hemoglobin: 14.4 g/dL (ref 12.0–15.0)
Immature Granulocytes: 1 %
Lymphocytes Relative: 36 %
Lymphs Abs: 1.1 10*3/uL (ref 0.7–4.0)
MCH: 32.7 pg (ref 26.0–34.0)
MCHC: 34.3 g/dL (ref 30.0–36.0)
MCV: 95.2 fL (ref 80.0–100.0)
Monocytes Absolute: 0.3 10*3/uL (ref 0.1–1.0)
Monocytes Relative: 11 %
Neutro Abs: 1.6 10*3/uL — ABNORMAL LOW (ref 1.7–7.7)
Neutrophils Relative %: 50 %
Platelets: 55 10*3/uL — ABNORMAL LOW (ref 150–400)
RBC: 4.41 MIL/uL (ref 3.87–5.11)
RDW: 13 % (ref 11.5–15.5)
Smear Review: NORMAL
WBC: 3.1 10*3/uL — ABNORMAL LOW (ref 4.0–10.5)
nRBC: 0 % (ref 0.0–0.2)

## 2020-11-26 LAB — COMPREHENSIVE METABOLIC PANEL
ALT: 42 U/L (ref 0–44)
AST: 69 U/L — ABNORMAL HIGH (ref 15–41)
Albumin: 2.9 g/dL — ABNORMAL LOW (ref 3.5–5.0)
Alkaline Phosphatase: 35 U/L — ABNORMAL LOW (ref 38–126)
Anion gap: 7 (ref 5–15)
BUN: 8 mg/dL (ref 8–23)
CO2: 24 mmol/L (ref 22–32)
Calcium: 8 mg/dL — ABNORMAL LOW (ref 8.9–10.3)
Chloride: 104 mmol/L (ref 98–111)
Creatinine, Ser: 0.57 mg/dL (ref 0.44–1.00)
GFR, Estimated: >60 mL/min (ref >60)
Glucose, Bld: 93 mg/dL (ref 70–99)
Potassium: 3.6 mmol/L (ref 3.5–5.1)
Sodium: 135 mmol/L (ref 135–145)
Total Bilirubin: 0.9 mg/dL (ref 0.3–1.2)
Total Protein: 5.8 g/dL — ABNORMAL LOW (ref 6.5–8.1)

## 2020-11-26 LAB — URINALYSIS, COMPLETE (UACMP) WITH MICROSCOPIC
Bilirubin Urine: NEGATIVE
Glucose, UA: NEGATIVE mg/dL
Ketones, ur: NEGATIVE mg/dL
Leukocytes,Ua: NEGATIVE
Nitrite: NEGATIVE
Protein, ur: NEGATIVE mg/dL
Specific Gravity, Urine: 1.003 — ABNORMAL LOW (ref 1.005–1.030)
pH: 7 (ref 5.0–8.0)

## 2020-11-26 LAB — PHOSPHORUS: Phosphorus: 3.9 mg/dL (ref 2.5–4.6)

## 2020-11-26 LAB — MAGNESIUM: Magnesium: 2.2 mg/dL (ref 1.7–2.4)

## 2020-11-26 MED ORDER — ENSURE ENLIVE PO LIQD
237.0000 mL | Freq: Two times a day (BID) | ORAL | Status: DC
  Administered 2020-11-26: 237 mL via ORAL

## 2020-11-26 MED ORDER — DIVALPROEX SODIUM 125 MG PO DR TAB
125.0000 mg | DELAYED_RELEASE_TABLET | Freq: Every day | ORAL | Status: DC
  Filled 2020-11-26: qty 1

## 2020-11-26 MED ORDER — ADULT MULTIVITAMIN W/MINERALS CH
1.0000 | ORAL_TABLET | Freq: Every day | ORAL | Status: DC
  Administered 2020-11-27: 1 via ORAL
  Filled 2020-11-26: qty 1

## 2020-11-26 NOTE — Progress Notes (Signed)
PROGRESS NOTE    [name]  PJA:[id number] DOB: February 11, 1941 DOA: 11/23/2020 PCP: [name], MD   Chief Complaint  Patient presents with   Weakness    Brief Narrative:   Olivia Martin is a 80 y.o. female with medical history significant for hypertension, Alzheimer's dementia, hyperlipidemia, history of bradycardia, presents to the emergency department from urgent care/walk-in clinic for chief concerns of lethargy.  She's been admitted for weakness, malaise, lethargy with UA findings concerning for UTI.   Assessment & Plan:   Principal Problem:   Sepsis secondary to UTI [facility]) Active Problems:   UTI (urinary tract infection)   Essential hypertension   Hyperlipidemia   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   Depression   Alzheimer's dementia (HCC)  # Sepsis 2/2 UTI - sepsis due to fever, tachy, tachypnea and UA c/w UTI - s/p 3 L NS - continue ceftriaxone.  Watch for diarrhea.  Nursing reports 2 loose stool today - blood cx with no growth and urine cultures with multiple species - despite negative urine culture, given presentation with fever, UA with positive nitrite/RBC's/WBC's, thrombocytopenia, will complete therapy for UTI.  Patient still been having low-grade fever overnight  # Abnormal UA  Microscopic Hematuria  - suspected due to UTI - follow repeat and outpatient  # Hypokalemia  hypomagnesemia -Repleted and resolved   # Thrombocytopenia  Leukopenia - worsening today, follow  # History of hypertension - continue to hold hydrochlorothiazide, losartan, amlodipine given reasonable BP's today off these meds - stable   # Hyperlipidemia-rosuvastatin 20 mg daily    # Dementia, likely Alzheimer's-appears moderate to advanced -Continue home donepezil 10 mg p.o. daily at bedtime - Memantine 10 mg p.o. twice daily.  Trazodone 25 mg p.o. nightly as needed for sleep  # on depakote, mood stabilizer? Will need to review with daughter -Considering  her thrombocytopenia I will cut back on the dose of Depakote   Chart reviewed.    Echo on 10/17/2020 was read as ejection fraction estimated greater than 55%, normal left ventricular systolic and normal right ventricular systolic function.  No valvular stenosis.  Trivial MR.  Mild TR.  DVT prophylaxis: SCD Code Status: DNR Family Communication: sister at bedside.  Daughter updated over phone on 7/5 Disposition:   Status is: Inpatient  Remains inpatient appropriate because:Inpatient level of care appropriate due to severity of illness  Dispo: The patient is from:  home              Anticipated d/c is to: Home              Patient currently is not medically stable to d/c.  Still spiking low-grade fever.  Daughter not comfortable taking her home back this way.   Difficult to place patient No    Consultants:  Palliative care  Procedures:  none  Antimicrobials: Anti-infectives (From admission, onward)    Start     Dose/Rate Route Frequency Ordered Stop   11/24/20 0800  cefTRIAXone (ROCEPHIN) 1 g in sodium chloride 0.9 % 100 mL IVPB        1 g 200 mL/hr over 30 Minutes Intravenous Every 24 hours 11/23/20 1959     11/23/20 1700  vancomycin (VANCOCIN) IVPB 1000 mg/200 mL premix        1,000 mg 200 mL/hr over 60 Minutes Intravenous  Once 11/23/20 1600 11/24/20 0020   11/23/20 1545  ceFEPIme (MAXIPIME) 2 g in sodium chloride 0.9 % 100 mL IVPB  2 g 200 mL/hr over 30 Minutes Intravenous  Once 11/23/20 1542 11/23/20 1646   11/23/20 1545  metroNIDAZOLE (FLAGYL) IVPB 500 mg        500 mg 100 mL/hr over 60 Minutes Intravenous  Once 11/23/20 1542 11/23/20 1807   11/23/20 1545  vancomycin (VANCOCIN) IVPB 1000 mg/200 mL premix        1,000 mg 200 mL/hr over 60 Minutes Intravenous  Once 11/23/20 1542 11/23/20 2336          Subjective: Confused, low-grade fever overnight.  Sister at bedside  Objective: Vitals:   11/25/20 2158 11/25/20 2320 11/26/20 0749 11/26/20 1141  BP:    128/71 (!) 128/56  Pulse:   75 79  Resp:   16 18  Temp: 98.9 F (37.2 C) 99.2 F (37.3 C) 99.5 F (37.5 C) 98 F (36.7 C)  TempSrc: Oral Axillary    SpO2:   94% 92%  Weight:      Height:        Intake/Output Summary (Last 24 hours) at 11/26/2020 1426 Last data filed at 11/26/2020 1021 Gross per 24 hour  Intake 0 ml  Output 900 ml  Net -900 ml   Filed Weights   11/23/20 1548  Weight: 92.5 kg    Examination:  General: No acute distress. Cardiovascular: Heart sounds show a regular rate, and rhythm. Lungs: Clear to auscultation bilaterally . Abdomen: Soft, nontender, nondistended  Neurological: Alert, but confused - can't name place or month. Moves all extremities 4 . Cranial nerves II through XII intact. Skin: Warm and dry. No rashes or lesions. Extremities: No clubbing or cyanosis. No edema.      Data Reviewed: I have personally reviewed following labs and imaging studies  CBC: Recent Labs  Lab 11/23/20 1236 11/24/20 1157 11/25/20 0606 11/26/20 0647  WBC 5.6 3.5* 2.6* 3.1*  NEUTROABS 4.7 2.7 1.7 1.6*  HGB 14.9 12.2 12.8 14.4  HCT 43.7 36.2 36.5 42.0  MCV 95.0 97.8 94.3 95.2  PLT 114* 70* 64* 55*    Basic Metabolic Panel: Recent Labs  Lab 11/23/20 1236 11/24/20 1157 11/24/20 1954 11/25/20 0606 11/26/20 0647  NA 138 136 134* 133* 135  K 2.9* 2.7* 3.7 3.8 3.6  CL 105 107 107 104 104  CO2 21* 21* 18* 24 24  GLUCOSE 155* 95 129* 96 93  BUN 21 12 12 10 8   CREATININE 0.85 0.61 0.75 0.70 0.57  CALCIUM 9.0 7.3* 7.7* 7.9* 8.0*  MG 2.0  --   --  1.6* 2.2  PHOS  --   --   --  1.8* 3.9    GFR: Estimated Creatinine Clearance: 64.4 mL/min (by C-G formula based on SCr of 0.57 mg/dL).  Liver Function Tests: Recent Labs  Lab 11/23/20 1236 11/24/20 1157 11/25/20 0606 11/26/20 0647  AST 99* 67* 59* 69*  ALT 56* 41 38 42  ALKPHOS 43 31* 32* 35*  BILITOT 0.9 0.6 0.6 0.9  PROT 7.5 5.3* 5.5* 5.8*  ALBUMIN 3.9 2.7* 2.8* 2.9*    CBG: No results for  input(s): GLUCAP in the last 168 hours.   Recent Results (from the past 240 hour(s))  Culture, blood (Routine x 2)     Status: None (Preliminary result)   Collection Time: 11/23/20 12:36 PM   Specimen: BLOOD  Result Value Ref Range Status   Specimen Description BLOOD BLOOD RIGHT HAND  Final   Special Requests   Final    BOTTLES DRAWN AEROBIC ONLY Blood Culture results may  not be optimal due to an inadequate volume of blood received in culture bottles   Culture   Final    NO GROWTH 3 DAYS Performed at [facility], [address] [place], [place] [address]    Report Status PENDING  Incomplete  Culture, blood (Routine x 2)     Status: None (Preliminary result)   Collection Time: 11/23/20 12:44 PM   Specimen: BLOOD  Result Value Ref Range Status   Specimen Description BLOOD BLOOD RIGHT ARM  Final   Special Requests   Final    BOTTLES DRAWN AEROBIC AND ANAEROBIC Blood Culture adequate volume   Culture   Final    NO GROWTH 3 DAYS Performed at [facility], [address] [place], [place] [address]    Report Status PENDING  Incomplete  Resp Panel by RT-PCR (Flu A&B, Covid)     Status: None   Collection Time: 11/23/20  6:22 PM   Specimen: Nasopharyngeal(NP) swabs in vial transport medium  Result Value Ref Range Status   SARS Coronavirus 2 by RT PCR NEGATIVE NEGATIVE Final    Comment: (NOTE) SARS-CoV-2 target nucleic acids are NOT DETECTED.  The SARS-CoV-2 RNA is generally detectable in upper respiratory specimens during the acute phase of infection. The lowest concentration of SARS-CoV-2 viral copies this assay can detect is 138 copies/mL. A negative result does not preclude SARS-Cov-2 infection and should not be used as the sole basis for treatment or other patient management decisions. A negative result may occur with  improper specimen collection/handling, submission of specimen other than nasopharyngeal swab, presence of viral mutation(s) within  the areas targeted by this assay, and inadequate number of viral copies(<138 copies/mL). A negative result must be combined with clinical observations, patient history, and epidemiological information. The expected result is Negative.  Fact Sheet for Patients:  BloggerCourse.comhttps://www.fda.gov/media/152166/download  Fact Sheet for Healthcare Providers:  SeriousBroker.ithttps://www.fda.gov/media/152162/download  This test is no t yet approved or cleared by the [place] FDA and  has been authorized for detection and/or diagnosis of SARS-CoV-2 by FDA under an Emergency Use Authorization (EUA). This EUA will remain  in effect (meaning this test can be used) for the duration of the COVID-19 declaration under Section 564(b)(1) of the Act, 21 U.S.C.section 360bbb-3(b)(1), unless the authorization is terminated  or revoked sooner.       Influenza A by PCR NEGATIVE NEGATIVE Final   Influenza B by PCR NEGATIVE NEGATIVE Final    Comment: (NOTE) The Xpert Xpress SARS-CoV-2/FLU/RSV plus assay is intended as an aid in the diagnosis of influenza from Nasopharyngeal swab specimens and should not be used as a sole basis for treatment. Nasal washings and aspirates are unacceptable for Xpert Xpress SARS-CoV-2/FLU/RSV testing.  Fact Sheet for Patients: BloggerCourse.comhttps://www.fda.gov/media/152166/download  Fact Sheet for Healthcare Providers: SeriousBroker.ithttps://www.fda.gov/media/152162/download  This test is not yet approved or cleared by the [place] FDA and has been authorized for detection and/or diagnosis of SARS-CoV-2 by FDA under an Emergency Use Authorization (EUA). This EUA will remain in effect (meaning this test can be used) for the duration of the COVID-19 declaration under Section 564(b)(1) of the Act, 21 U.S.C. section 360bbb-3(b)(1), unless the authorization is terminated or revoked.  Performed at [facility], [address] [place], [place] [address]   Urine culture     Status: Abnormal   Collection  Time: 11/23/20  6:22 PM   Specimen: Urine, Random  Result Value Ref Range Status   Specimen Description   Final  URINE, RANDOM Performed at [facility], [address] [place], [place] [address]    Special Requests   Final    NONE Performed at [facility], [address] [place], [place] [address]    Culture MULTIPLE SPECIES PRESENT, SUGGEST RECOLLECTION (A)  Final   Report Status 11/25/2020 FINAL  Final         Radiology Studies: US RENAL  Result Date: 11/25/2020 CLINICAL DATA:  Back pain and urinary tract infection. EXAM: RENAL / URINARY TRACT ULTRASOUND COMPLETE COMPARISON:  CT of the abdomen and pelvis on 11/13/2008 FINDINGS: Right Kidney: Renal measurements: 10.3 x 5.2 x 4.9 centimeters = volume: 138.5 mL. Echogenicity within normal limits. No mass or hydronephrosis visualized. Left Kidney: Renal measurements: 10.9 x 5.7 x 5.5 centimeters = volume: 180.7 mL. Echogenicity is normal. No hydronephrosis. Small parapelvic cyst is 2.1 x 1.8 x 1.3 centimeters. Note is made of focal scarring in the LOWER pole region, similar in appearance to prior CT exam. Bladder: Appears normal for degree of bladder distention. Other: Study quality is degraded by limited ability of the patient to breath hold. IMPRESSION: 1. No hydronephrosis. 2. No suspicious renal lesions. Electronically Signed   By: Norva Pavlov M.D.   On: 11/25/2020 12:43        Scheduled Meds:  divalproex  125 mg Oral BID   donepezil  10 mg Oral QHS   feeding supplement  237 mL Oral BID BM   memantine  10 mg Oral BID   [START ON 11/27/2020] multivitamin with minerals  1 tablet Oral Daily   pantoprazole  40 mg Oral q AM   rosuvastatin  20 mg Oral QHS   Continuous Infusions:  cefTRIAXone (ROCEPHIN)  IV 1 g (11/26/20 0836)     LOS: 2 days    Time spent: over 30 min    Edyth Glomb Sherryll Burger, MD [facility]   To contact the attending provider between 7A-7P or the covering provider during after  hours 7P-7A, please log into the web site [email or web address] and access using universal [place] password for that web site. If you do not have the password, please call the hospital operator.  11/26/2020, 2:26 PM

## 2020-11-26 NOTE — Progress Notes (Signed)
Initial Nutrition Assessment  DOCUMENTATION CODES:   Obesity unspecified  INTERVENTION:   Ensure Enlive po BID, each supplement provides 350 kcal and 20 grams of protein  MVI po daily   Liberalize diet   NUTRITION DIAGNOSIS:   Inadequate oral intake related to acute illness as evidenced by per patient/family report.  GOAL:   Patient will meet greater than or equal to 90% of their needs  MONITOR:   PO intake, Supplement acceptance, Labs, Weight trends, Skin, I & O's  REASON FOR ASSESSMENT:   Malnutrition Screening Tool    ASSESSMENT:   80 y.o. female with medical history significant for hypertension, Alzheimer's dementia, hyperlipidemia and bradycardia who is admitted with UTI and sepsis  RD working remotely.  Unable to speak with pt via phone r/t dementia. Per chart review, pt with poor oral intake and AMS pta. Per chart, pt ate 100% of her breakfast this morning. RD suspects pt with good oral intake at baseline. Per chart, pt's UBW appears to be ~212-214lbs. Per chart, pt is down 8lbs(4%) since May; this is not significant. RD will add supplements and MVI to help pt meet her estimated needs. RD will also liberalize pt's diet. RD will obtain NFPE at follow up.   Medications reviewed and include: protonix, ceftriaxone   Labs reviewed: K 3.6 wnl, P 3.9 wnl, Mg 2.2 wnl Wbc- 3.1(L)  NUTRITION - FOCUSED PHYSICAL EXAM: Unable to perform at this time   Diet Order:   Diet Order             Diet Heart Room service appropriate? Yes; Fluid consistency: Thin  Diet effective now                  EDUCATION NEEDS:   No education needs have been identified at this time  Skin:  Skin Assessment: Reviewed RN Assessment  Last BM:  7/4- type 7  Height:   Ht Readings from Last 1 Encounters:  11/23/20 5\' 7"  (1.702 m)    Weight:   Wt Readings from Last 1 Encounters:  11/23/20 92.5 kg    Ideal Body Weight:  61.36 kg  BMI:  Body mass index is 31.95  kg/m.  Estimated Nutritional Needs:   Kcal:  1700-2000kcal/day  Protein:  90-100g/day  Fluid:  1.6-1.9L/day  01/24/21 MS, RD, LDN Please refer to [facility] for RD and/or RD on-call/weekend/after hours pager

## 2020-11-27 DIAGNOSIS — G301 Alzheimer's disease with late onset: Secondary | ICD-10-CM

## 2020-11-27 DIAGNOSIS — Z66 Do not resuscitate: Secondary | ICD-10-CM

## 2020-11-27 DIAGNOSIS — A419 Sepsis, unspecified organism: Principal | ICD-10-CM

## 2020-11-27 DIAGNOSIS — Z515 Encounter for palliative care: Secondary | ICD-10-CM

## 2020-11-27 DIAGNOSIS — Z7189 Other specified counseling: Secondary | ICD-10-CM

## 2020-11-27 DIAGNOSIS — F0281 Dementia in other diseases classified elsewhere with behavioral disturbance: Secondary | ICD-10-CM

## 2020-11-27 DIAGNOSIS — R059 Cough, unspecified: Secondary | ICD-10-CM

## 2020-11-27 LAB — CBC
HCT: 36.7 % (ref 36.0–46.0)
Hemoglobin: 12.8 g/dL (ref 12.0–15.0)
MCH: 32.9 pg (ref 26.0–34.0)
MCHC: 34.9 g/dL (ref 30.0–36.0)
MCV: 94.3 fL (ref 80.0–100.0)
Platelets: 61 10*3/uL — ABNORMAL LOW (ref 150–400)
RBC: 3.89 MIL/uL (ref 3.87–5.11)
RDW: 12.9 % (ref 11.5–15.5)
WBC: 4.2 10*3/uL (ref 4.0–10.5)
nRBC: 0 % (ref 0.0–0.2)

## 2020-11-27 LAB — BASIC METABOLIC PANEL
Anion gap: 5 (ref 5–15)
BUN: 9 mg/dL (ref 8–23)
CO2: 26 mmol/L (ref 22–32)
Calcium: 7.7 mg/dL — ABNORMAL LOW (ref 8.9–10.3)
Chloride: 106 mmol/L (ref 98–111)
Creatinine, Ser: 0.53 mg/dL (ref 0.44–1.00)
GFR, Estimated: >60 mL/min (ref >60)
Glucose, Bld: 97 mg/dL (ref 70–99)
Potassium: 3.2 mmol/L — ABNORMAL LOW (ref 3.5–5.1)
Sodium: 137 mmol/L (ref 135–145)

## 2020-11-27 MED ORDER — POTASSIUM CHLORIDE CRYS ER 20 MEQ PO TBCR
40.0000 meq | EXTENDED_RELEASE_TABLET | Freq: Once | ORAL | Status: AC
  Administered 2020-11-27: 40 meq via ORAL
  Filled 2020-11-27: qty 2

## 2020-11-27 MED ORDER — SODIUM CHLORIDE 0.9 % IV SOLN
1.0000 g | Freq: Once | INTRAVENOUS | Status: AC
  Administered 2020-11-27: 1 g via INTRAVENOUS
  Filled 2020-11-27: qty 10

## 2020-11-27 NOTE — Progress Notes (Signed)
[facility] Room [address] [facility] Liaison note:  Notified by Dr Delfino Lovett of request for [facility]iative [facility] services. Will continue to follow for disposition.  Please call with any outpatient palliative questions or concerns.  Thank you for the opportunity to participate in this patient's care.  Thank you, Abran Cantor, LPN [facility] Liaison [phone]

## 2020-11-27 NOTE — Progress Notes (Signed)
Physical Therapy Treatment Patient Details Name: Olivia Martin MRN: [id number] DOB: 06-13-1940 Today's Date: 11/27/2020    History of Present Illness Pt is an 80 y/o F admitted on 11/23/20 with c/c of lethargy & confusion. Pt being treated for sepsis 2/2 UTI. PMH: HTN, alzheimer's dementia, HLD, hx of bradycardia    PT Comments    Pt aware upon 2nd attempt, DTR still in room. Pt follows simple cues for bed mobility and transfers training, but continues to require min-maxA for basic mobility. DTR observes much of session, reports mobility needs appear similar to baseline level of function. Pt participates in multiple STS transfer and sustained standing balance to improve facility, technique, and confidence, as acute onset weakness creates increased falls anxiety and additional dependence for mobility. AMB not performed due to leaking diaper, NA enroute to address cleanup needs. Discussed concerns with DTR about pt not being especially ambulatory here since arrival, remaining weakness, and need to perform 5 stairs to enter the home at DC. DTR offers that pt can DC to her sister's house where she is able to utilize handicap ramp to access living environment. Chartered loss adjuster also mentioned potential for EMS transport home, pt's DTR declines in lieu of other options.   Follow Up Recommendations  Home health PT;Supervision/Assistance - 24 hour     Equipment Recommendations  3in1 (PT)    Recommendations for Other Services       Precautions / Restrictions Precautions Precautions: Fall Restrictions Weight Bearing Restrictions: No    Mobility  Bed Mobility Overal bed mobility: Needs Assistance Bed Mobility: Supine to Sit;Sit to Supine     Supine to sit: Min assist Sit to supine: Min assist        Transfers Overall transfer level: Needs assistance Equipment used: Rolling walker (2 wheeled) Transfers: Sit to/from Stand Sit to Stand: Mod assist         General transfer comment: from  elevated surface can perform with minA, but is weak in legs and slightly anxious about full dependence on RW for stability.  Ambulation/Gait Ambulation/Gait assistance:  (deferred due to diaper being full of liquid bowel and leaking when in standing. Awaiting assistance for cleanup.)               Stairs             Wheelchair Mobility    Modified Rankin (Stroke Patients Only)       Balance                                            Cognition Arousal/Alertness: Awake/alert Behavior During Therapy: WFL for tasks assessed/performed;Impulsive Overall Cognitive Status: History of cognitive impairments - at baseline                                 General Comments: able to follow repeated simple cues, limited ability in expressive language, lots of word choice error/word salad.      Exercises Other Exercises Other Exercises: STS from elevated EOB, RW, minA, heavy multimodal cues, standing x20 sec each bout. (5 times)    General Comments        Pertinent Vitals/Pain Pain Assessment: No/denies pain    Home Living  Prior Function            PT Goals (current goals can now be found in the care plan section) Acute Rehab PT Goals PT Goal Formulation: Patient unable to participate in goal setting Time For Goal Achievement: 12/08/20 Progress towards PT goals: Progressing toward goals    Frequency    Min 2X/week      PT Plan Current plan remains appropriate    Co-evaluation              AM-PAC PT "6 Clicks" Mobility   Outcome Measure  Help needed turning from your back to your side while in a flat bed without using bedrails?: A Little Help needed moving from lying on your back to sitting on the side of a flat bed without using bedrails?: A Lot Help needed moving to and from a bed to a chair (including a wheelchair)?: Total Help needed standing up from a chair using your arms (e.g.,  wheelchair or bedside chair)?: Total Help needed to walk in hospital room?: Total Help needed climbing 3-5 steps with a railing? : Total 6 Click Score: 9    End of Session Equipment Utilized During Treatment: Gait belt Activity Tolerance: Patient tolerated treatment well;No increased pain;Other (comment) (diaper failure with high code-brown potential) Patient left: in bed;with call bell/phone within reach;with family/visitor present;with bed alarm set Nurse Communication: Mobility status PT Visit Diagnosis: Unsteadiness on feet (R26.81);Muscle weakness (generalized) (M62.81);Difficulty in walking, not elsewhere classified (R26.2)     Time: 4098-1191 PT Time Calculation (min) (ACUTE ONLY): 15 min  Charges:  $Therapeutic Exercise: 8-22 mins                    2:42 PM, 11/27/20 Rosamaria Lints, PT, DPT Physical Therapist - [facility]  [phone] (ASCOM)     Myleka Moncure C 11/27/2020, 2:37 PM

## 2020-11-27 NOTE — Progress Notes (Signed)
Physical Therapy Treatment Patient Details Name: Olivia Martin MRN: [id number] DOB: 29-Aug-1940 Today's Date: 11/27/2020    History of Present Illness Pt is an 80 y/o F admitted on 11/23/20 with c/c of lethargy & confusion. Pt being treated for sepsis 2/2 UTI. PMH: HTN, alzheimer's dementia, HLD, hx of bradycardia    PT Comments    Pt seen again for BID session to assess appropriateness of DC to home with stairs entry. Pt able to advance AMB to 145ft, likely could have gone farther, but Thereasa Parkin decided to conserve energy for subsequent stairs training. Pt able to climb 1 step with near-maximal effort, very unsteady, weakness precludes capacity to modify motor patterns which put safety at increased risk. Pt can then again perform 1 step after rest 90 sec. Training assisted with 2 family members who assist with stairs at baseline and will be assisting at DC later today. Pt lacks sufficient strength to perform stairs entry of home today, recommend EMS transport back to home today at DC. RNCM aware, also present for stairs training.     Follow Up Recommendations  Home health PT;Supervision/Assistance - 24 hour     Equipment Recommendations  3in1 (PT)    Recommendations for Other Services       Precautions / Restrictions Precautions Precautions: Fall Restrictions Weight Bearing Restrictions: No    Mobility  Bed Mobility Overal bed mobility: Needs Assistance Bed Mobility: Supine to Sit     Supine to sit: Min assist Sit to supine: Min assist        Transfers Overall transfer level: Needs assistance Equipment used: Rolling walker (2 wheeled) Transfers: Sit to/from Stand Sit to Stand: Mod assist         General transfer comment: modA from EOB, modA from recliner  Ambulation/Gait Ambulation/Gait assistance: Min guard Gait Distance (Feet): 100 Feet Assistive device: Rolling walker (2 wheeled)   Gait velocity: increased   General Gait Details: forward flexed, but is  fairly steady with RW, no LOB, heavy cues for directions, limited mostly by [facility]; tries to DC RW too soon once recliner in sight, but author assure safe RW use until ready to sit.   Stairs Stairs: Yes Stairs assistance: Min assist Stair Management: One rail Right;Forwards;Backwards Number of Stairs: 2 General stair comments: 1 rail, 1 hand, heavy cues due to dementia and difficulty with novel motor planning strategies. Pt really struggles with safety and cues and needs 90sec recovery between each step; not safe to attempt >2 steps at this time, and certainly not to enter home after DC today.   Wheelchair Mobility    Modified Rankin (Stroke Patients Only)       Balance                                            Cognition Arousal/Alertness: Awake/alert Behavior During Therapy: WFL for tasks assessed/performed;Impulsive Overall Cognitive Status: History of cognitive impairments - at baseline                                 General Comments: able to follow repeated simple cues, limited ability in expressive language, lots of word choice error/word salad.      Exercises Other Exercises Other Exercises: STS from elevated EOB, RW, minA, heavy multimodal cues, standing x20 sec each bout. (5 times)  General Comments        Pertinent Vitals/Pain Pain Assessment: No/denies pain    Home Living                      Prior Function            PT Goals (current goals can now be found in the care plan section) Acute Rehab PT Goals PT Goal Formulation: Patient unable to participate in goal setting Time For Goal Achievement: 12/08/20 Progress towards PT goals: Progressing toward goals    Frequency    Min 2X/week      PT Plan Current plan remains appropriate    Co-evaluation              AM-PAC PT "6 Clicks" Mobility   Outcome Measure  Help needed turning from your back to your side while in a flat bed without using  bedrails?: A Little Help needed moving from lying on your back to sitting on the side of a flat bed without using bedrails?: A Lot Help needed moving to and from a bed to a chair (including a wheelchair)?: Total Help needed standing up from a chair using your arms (e.g., wheelchair or bedside chair)?: Total Help needed to walk in hospital room?: Total Help needed climbing 3-5 steps with a railing? : A Lot 6 Click Score: 10    End of Session Equipment Utilized During Treatment: Gait belt Activity Tolerance: Patient tolerated treatment well;Patient limited by fatigue Patient left: with call bell/phone within reach;with family/visitor present;in chair Nurse Communication: Mobility status PT Visit Diagnosis: Unsteadiness on feet (R26.81);Muscle weakness (generalized) (M62.81);Difficulty in walking, not elsewhere classified (R26.2)     Time: 1545-1610 PT Time Calculation (min) (ACUTE ONLY): 25 min  Charges:  $Gait Training: 23-37 mins                    4:22 PM, 11/27/20 Rosamaria Lints, PT, DPT Physical Therapist - [facility]  [phone] (ASCOM)    [name] C 11/27/2020, 4:16 PM

## 2020-11-27 NOTE — Plan of Care (Signed)
Patient discharged home per Md orders at this time.All discharge instructions,education and medications reviewed with patient and sister at bedside.follow up appointments also communicated to Pt.patient expressed understanding and will comply with dc instructions. Patient discharged home with HH/PT services per order.Pt transported home by 2 EMS personnel on a stretcher.

## 2020-11-27 NOTE — Consult Note (Signed)
Consultation Note Date: 11/27/2020   Patient Name: Olivia Martin  DOB: May 11, 1941  MRN: [id number]  Age / Sex: 80 y.o., female  PCP: [place] Pink, MD Referring Physician: No att. providers found  Reason for Consultation: Establishing goals of care  HPI/Patient Profile: 80 y.o. female  with past medical history of hypertension, Alzheimer's dementia, hyperlipidemia, and history of bradycardia admitted on 11/23/2020 with AMS and UA findings concerning for UTI. PMT consulted to discuss [place].  Clinical Assessment and Goals of Care: I have reviewed medical records including EPIC notes, labs and imaging, assessed the patient and then met with patient and daughter Pamala Hurry  to discuss diagnosis prognosis, [place], EOL wishes, disposition and options.  Patient unable to participate in [place] discussion. She is alert and speaks to me but clearly confused. Denies complaints.  I introduced Palliative Medicine as specialized medical care for people living with serious illness. It focuses on providing relief from the symptoms and stress of a serious illness. The goal is to improve quality of life for both the patient and the family.  As far as functional and nutritional status, Pamala Hurry shares that patient lives independently but has family next door that check on her frequently. Pamala Hurry shares patient is mostly chair bound - she can ambulate some with walker and assistance. Pamala Hurry shares that patient has maintained a great appetite at home - no problems swallowing. Pamala Hurry shares about cognitive decline - patient still able to recognize most family members. Very repetitive.   We discussed patient's current illness and what it means in the larger context of patient's on-going co-morbidities. Discussed treatment for current UTI and plans for hopeful dc soon.   I attempted to elicit values and goals of care important to the patient.  Family shares patient wants to be at  home.   Discussed with family the importance of continued conversation with family and the medical providers regarding overall plan of care and treatment options, ensuring decisions are within the context of the patient's values and GOCs.    Palliative Care services outpatient were explained and offered. Family agreeable to outpatient palliative  Questions and concerns were addressed. The family was encouraged to call with questions or concerns.    Primary Decision Maker NEXT OF KIN    SUMMARY OF RECOMMENDATIONS    - home with outpatient palliative - plans for dc today - patient not eligible for support of hospice - not yet at FAST 7c of dementia disease process  Code Status/Advance Care Planning: DNR  Discharge Planning: Home with Palliative Services      Primary Diagnoses: Present on Admission:  UTI (urinary tract infection)  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   I have reviewed the medical record, interviewed the patient and family, and examined the patient. The following aspects are pertinent.  Past Medical History:  Diagnosis Date   Allergy    Arthritis    Hypertension    Lump or mass in breast 2014   Social History   Socioeconomic History   Marital status: Single    Spouse name: Not on file   Number of children: Not on file   Years of education: Not on file   Highest education level: Not on file  Occupational History   Not on file  Tobacco Use   Smoking status: Former    Packs/day: 1.00    Years: 20.00    Pack years: 20.00    Types: Cigarettes   Smokeless tobacco: Never  Substance and Sexual  Activity   Alcohol use: No   Drug use: No   Sexual activity: Not on file  Other Topics Concern   Not on file  Social History Narrative   Not on file   Social Determinants of Health   Financial Resource Strain: Not on file  Food Insecurity: Not on file  Transportation Needs: Not on file  Physical Activity: Not on file  Stress: Not on  file  Social Connections: Not on file   Family History  Problem Relation Age of Onset   Cancer Mother        lung cancer, died at age 38   Cancer Brother 77       lung cancer   Cancer Sister 39       breast cancer   Breast cancer Sister 94   Scheduled Meds:  divalproex  125 mg Oral QHS   donepezil  10 mg Oral QHS   memantine  10 mg Oral BID   multivitamin with minerals  1 tablet Oral Daily   pantoprazole  40 mg Oral q AM   rosuvastatin  20 mg Oral QHS   Continuous Infusions: PRN Meds:.acetaminophen **OR** acetaminophen, haloperidol lactate, ondansetron **OR** ondansetron (ZOFRAN) IV, traZODone Allergies  Allergen Reactions   Prednisone Swelling and Other (See Comments)    "felt like I couldn't breathe"   Review of Systems  Unable to perform ROS: Dementia   Physical Exam Constitutional:      General: She is not in acute distress. Pulmonary:     Effort: Pulmonary effort is normal.  Skin:    General: Skin is warm and dry.  Neurological:     Mental Status: She is alert. She is disoriented.  Psychiatric:        Cognition and Memory: Cognition is impaired. Memory is impaired.    Vital Signs: BP 129/70 (BP Location: Left Arm)   Pulse 70   Temp 98.4 F (36.9 C) (Oral)   Resp 17   Ht 5' 7" (1.702 m)   Wt 92.5 kg   SpO2 93%   BMI 31.95 kg/m  Pain Scale: 0-10 POSS *See Group Information*: 1-Acceptable,Awake and alert Pain Score: 0-No pain   SpO2: SpO2: 93 % O2 Device:SpO2: 93 % O2 Flow Rate: .O2 Flow Rate (L/min): 0 L/min  IO: Intake/output summary:  Intake/Output Summary (Last 24 hours) at 11/27/2020 1857 Last data filed at 11/27/2020 1402 Gross per 24 hour  Intake 240 ml  Output --  Net 240 ml    LBM: Last BM Date: 11/26/20 Baseline Weight: Weight: 92.5 kg Most recent weight: Weight: 92.5 kg     Palliative Assessment/Data: PPS 50%    Time Total: 60 minutes Greater than 50%  of this time was spent counseling and coordinating care related to the above  assessment and plan.  Juel Burrow, DNP, AGNP-C Palliative Medicine Team [phone] Pager: [phone]

## 2020-11-27 NOTE — TOC Progression Note (Signed)
Transition of Care [name]) - Progression Note    Patient Details  Name: Olivia Martin MRN: [id number] Date of Birth: Jul 16, 1940  Transition of Care [name]) CM/SW [name], RN Phone Number: 11/27/2020, 1:27 PM  Clinical Narrative:    Met with the patient and her sister in the room, she lives at home with family and they are available and take care of her 24/7, She has a Wheelchair ant home and a rolling walker, she needs a 3 in 1, I contacted [place] at [employer]. [name] services. She has transportation with family. TOC CM will continue to monitor for needs   Expected Discharge Plan: [name] Barriers to Discharge: Continued Medical Work up  Expected Discharge Plan and Services Expected Discharge Plan: [place] In-house Referral: Clinical Social Work   Post Acute Care Choice: [name] arrangements for the past 2 months: Single Family Home                           HH Arranged: PT, OT, RN [place] Agency: [name] [name]) Date HH Agency Contacted: 11/25/20 Time [place]: 108 Representative spoke with at [place]: [name] ([name]) Interventions    Readmission Risk Interventions No flowsheet data found.

## 2020-11-28 LAB — CULTURE, BLOOD (ROUTINE X 2)
Culture: NO GROWTH
Culture: NO GROWTH
Special Requests: ADEQUATE

## 2020-11-29 NOTE — Discharge Summary (Signed)
5       [place] at [facility]   PATIENT NAME: Olivia Martin    MR#:  [id number]  DATE OF BIRTH:  Dec 05, 1940  DATE OF ADMISSION:  11/23/2020   ADMITTING PHYSICIAN: A Grier Mitts., MD  DATE OF DISCHARGE: 11/27/2020  5:10 PM  PRIMARY CARE PHYSICIAN: [name], MD   ADMISSION DIAGNOSIS:  Cough [R05.9] UTI (urinary tract infection) [N39.0] Urinary tract infection without hematuria, site unspecified [N39.0] Sepsis, due to unspecified organism, unspecified whether acute organ dysfunction present (HCC) [A41.9] DISCHARGE DIAGNOSIS:  Principal Problem:   Sepsis (HCC) Active Problems:   UTI (urinary tract infection)   Essential hypertension   Hyperlipidemia   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 female with medical history significant for hypertension, Alzheimer's dementia, hyperlipidemia, history of bradycardia admitted for sepsis due to UTI.  Patient was brought into the ED for lethargy.  She's been admitted for weakness, malaise, lethargy with UA findings concerning for UTI.   # Sepsis present on admission due to UTI with fever, tachy, tachypnea  -Sepsis resolved.  Urine culture had multiple different organism.  Patient improved symptomatically.  Remained afebrile, no leukocytosis   # Abnormal UA  Microscopic Hematuria - suspected due to UTI -Urine culture had multiple species   # Hypokalemia  hypomagnesemia -Repleted and resolved   # Thrombocytopenia  Leukopenia -Stable   # History of hypertension - Controlled   # Hyperlipidemia-rosuvastatin 20 mg daily   # Dementia, likely Alzheimer's-appears moderate to advanced -Continue home donepezil 10 mg p.o. daily at bedtime - Memantine 10 mg p.o.  twice daily.     DISCHARGE CONDITIONS:  Fair CONSULTS OBTAINED:   DRUG ALLERGIES:   Allergies  Allergen Reactions   Prednisone Swelling and Other (See Comments)    "felt like I couldn't breathe"   DISCHARGE MEDICATIONS:   Allergies as of 11/27/2020       Reactions   Prednisone Swelling, Other (See Comments)   "felt like I couldn't breathe"        Medication List     TAKE these medications    amLODipine 10 MG tablet Commonly known as: NORVASC Take 10 mg by mouth daily.   aspirin EC 81 MG tablet Take 81 mg by mouth daily.   baclofen 10 MG tablet Commonly known as: LIORESAL Take 5-10 mg by mouth See admin instructions. Take  tablet (5mg ) by mouth every morning and take 1 tablet (10mg ) by mouth every night at bedtime   divalproex 125 MG DR tablet Commonly known as: DEPAKOTE Take 125 mg by mouth 2 (two) times daily.   donepezil 10 MG tablet Commonly known as: ARICEPT Take 10 mg by mouth at bedtime.   hydrochlorothiazide 25 MG tablet Commonly known as: HYDRODIURIL Take 25 mg by mouth daily.   losartan 25 MG tablet Commonly known as: COZAAR Take 25 mg by mouth daily.   meloxicam 15 MG tablet Commonly known as: MOBIC Take 15 mg by mouth daily.   memantine 10 MG tablet Commonly known as: NAMENDA Take 10 mg by mouth 2 (two) times daily.   pantoprazole 40 MG tablet Commonly known as: PROTONIX Take 40 mg by mouth  daily.   potassium chloride SA 20 MEQ tablet Commonly known as: KLOR-CON Take 20 mEq by mouth daily.   rosuvastatin 20 MG tablet Commonly known as: CRESTOR Take 20 mg by mouth daily.   sertraline 50 MG tablet Commonly known as: ZOLOFT Take 25 mg by mouth daily.   tiZANidine 2 MG tablet Commonly known as: ZANAFLEX Take 2 mg by mouth at bedtime as needed for pain.       DISCHARGE INSTRUCTIONS:   DIET:  Regular diet DISCHARGE CONDITION:  Stable ACTIVITY:  Activity as tolerated OXYGEN:  Home Oxygen: No.  Oxygen Delivery: room  air DISCHARGE LOCATION:  home with home health PT and palliative care to follow  If you experience worsening of your admission symptoms, develop shortness of breath, life threatening emergency, suicidal or homicidal thoughts you must seek medical attention immediately by calling 911 or calling your MD immediately  if symptoms less severe.  You Must read complete instructions/literature along with all the possible adverse reactions/side effects for all the Medicines you take and that have been prescribed to you. Take any new Medicines after you have completely understood and accpet all the possible adverse reactions/side effects.   Please note  You were cared for by a hospitalist during your hospital stay. If you have any questions about your discharge medications or the care you received while you were in the hospital after you are discharged, you can call the unit and asked to speak with the hospitalist on call if the hospitalist that took care of you is not available. Once you are discharged, your primary care physician will handle any further medical issues. Please note that NO REFILLS for any discharge medications will be authorized once you are discharged, as it is imperative that you return to your primary care physician (or establish a relationship with a primary care physician if you do not have one) for your aftercare needs so that they can reassess your need for medications and monitor your lab values.    On the day of Discharge:  VITAL SIGNS:  Blood pressure 129/70, pulse 70, temperature 98.4 F (36.9 C), temperature source Oral, resp. rate 17, height 5\' 7"  (1.702 m), weight 92.5 kg, SpO2 93 %. PHYSICAL EXAMINATION:  GENERAL:  80 y.o.-year-old patient lying in the bed with no acute distress.  EYES: Pupils equal, round, reactive to light and accommodation. No scleral icterus. Extraocular muscles intact.  HEENT: Head atraumatic, normocephalic. Oropharynx and nasopharynx clear.  NECK:   Supple, no jugular venous distention. No thyroid enlargement, no tenderness.  LUNGS: Normal breath sounds bilaterally, no wheezing, rales,rhonchi or crepitation. No use of accessory muscles of respiration.  CARDIOVASCULAR: S1, S2 normal. No murmurs, rubs, or gallops.  ABDOMEN: Soft, non-tender, non-distended. Bowel sounds present. No organomegaly or mass.  EXTREMITIES: No pedal edema, cyanosis, or clubbing.  NEUROLOGIC: Cranial nerves II through XII are intact. Muscle strength 5/5 in all extremities. Sensation intact. Gait not checked.  PSYCHIATRIC: The patient is alert and oriented x 3.  SKIN: No obvious rash, lesion, or ulcer.  DATA REVIEW:   CBC Recent Labs  Lab 11/27/20 0551  WBC 4.2  HGB 12.8  HCT 36.7  PLT 61*    Chemistries  Recent Labs  Lab 11/26/20 0647 11/27/20 0551  NA 135 137  K 3.6 3.2*  CL 104 106  CO2 24 26  GLUCOSE 93 97  BUN 8 9  CREATININE 0.57 0.53  CALCIUM 8.0* 7.7*  MG 2.2  --   AST 69*  --  ALT 42  --   ALKPHOS 35*  --   BILITOT 0.9  --      Outpatient follow-up  Follow-up Information     [name], MD. Schedule an appointment as soon as possible for a visit in 1 week(s).   Specialty: Family Medicine Why: [facility] Discharge F/UP;  Office will call Patient Directly. Contact information: [address] [place] [address] [phone]                 30 Day Unplanned Readmission Risk Score    Flowsheet Row ED to Hosp-Admission (Discharged) from 11/23/2020 in [facility] (1A)  30 Day Unplanned Readmission Risk Score (%) 11.76 Filed at 11/27/2020 1600       This score is the patient's risk of an unplanned readmission within 30 days of being discharged (0 -100%). The score is based on dignosis, age, lab data, medications, orders, and past utilization.   Low:  0-14.9   Medium: 15-21.9   High: 22-29.9   Extreme: 30 and above           Management plans discussed with the patient,  family and they are in agreement.  CODE STATUS: Prior   TOTAL TIME TAKING CARE OF THIS PATIENT: 45 minutes.    Delfino Lovett M.D on 11/29/2020 at 4:51 PM  [facility]   CC: Primary care physician; [name], MD   Note: This dictation was prepared with Dragon dictation along with smaller phrase technology. Any transcriptional errors that result from this process are unintentional.

## 2020-12-04 ENCOUNTER — Telehealth: Payer: Self-pay | Admitting: Nurse Practitioner

## 2020-12-05 NOTE — Telephone Encounter (Signed)
Spoke with patient's daughter, Minette Brine, regarding the Palliative referral/services and all questions were answered and she was in agreement with beginning services with [place].  I have scheduled an In-home Consult for 12/26/20 @ 12:30 PM

## 2020-12-26 ENCOUNTER — Telehealth: Payer: Self-pay | Admitting: Nurse Practitioner

## 2020-12-26 ENCOUNTER — Other Ambulatory Visit: Payer: Self-pay

## 2020-12-26 ENCOUNTER — Other Ambulatory Visit: Payer: Medicare Other | Admitting: Nurse Practitioner

## 2020-12-26 NOTE — Telephone Encounter (Signed)
Britta Mccreedy, Ms. [place] daughter called, endorses they forgot about an appointment and she is trying to get her grandchildren in school, would like to reschedule visit. Rescheduled per request

## 2021-01-09 ENCOUNTER — Other Ambulatory Visit: Payer: Self-pay

## 2021-01-09 ENCOUNTER — Other Ambulatory Visit: Payer: Medicare Other | Admitting: Nurse Practitioner

## 2021-01-09 ENCOUNTER — Telehealth: Payer: Self-pay | Admitting: Nurse Practitioner

## 2021-01-09 NOTE — Telephone Encounter (Signed)
I called Ms. Olivia Martin daughter to confirm pc visit and covid screening. Britta Mccreedy endorses need to reschedule pc visit, rescheduled per request

## 2021-01-28 ENCOUNTER — Other Ambulatory Visit: Payer: Medicare Other | Admitting: Nurse Practitioner

## 2021-01-28 ENCOUNTER — Other Ambulatory Visit: Payer: Self-pay

## 2021-01-28 ENCOUNTER — Telehealth: Payer: Self-pay | Admitting: Nurse Practitioner

## 2021-01-28 NOTE — Telephone Encounter (Signed)
I called Olivia Martin to change in person visit to telemedicine due to provider isolation. Olivia Martin declined, wishes to reschedule. Olivia Martin endorses she was on a work call. Will put on reschedule list.

## 2021-01-30 ENCOUNTER — Telehealth: Payer: Self-pay | Admitting: Nurse Practitioner

## 2021-01-30 NOTE — Telephone Encounter (Signed)
Spoke with daughter Minette Brine and have rescheduled the Palliative Consult for 02/26/21 @ 12:30 PM

## 2021-02-26 ENCOUNTER — Other Ambulatory Visit: Payer: Medicare Other | Admitting: Nurse Practitioner

## 2021-02-26 ENCOUNTER — Encounter: Payer: Self-pay | Admitting: Nurse Practitioner

## 2021-02-26 ENCOUNTER — Other Ambulatory Visit: Payer: Self-pay

## 2021-02-26 DIAGNOSIS — R5381 Other malaise: Secondary | ICD-10-CM

## 2021-02-26 DIAGNOSIS — G301 Alzheimer's disease with late onset: Secondary | ICD-10-CM

## 2021-02-26 NOTE — Progress Notes (Signed)
[facility] Palliative Care Consult Note Telephone: [phone]  Fax: [phone]   Date of encounter: 02/26/21 4:44 PM PATIENT NAME: Olivia Martin [address] [place] [address]   [id number] (home)  DOB: 09/24/1940 MRN: [id number] PRIMARY CARE PROVIDER:    Maryland Pink, MD,  [address] [place] [address] [phone]  RESPONSIBLE PARTY:    Contact Information     Name Relation Home Work Mobile   Olivia Martin Daughter   [phone]   Olivia Martin Daughter [phone]        I met face to face with patient and family in home. Palliative Care was asked to follow this patient by consultation request of  Olivia Pink, MD to address advance care planning and complex medical decision making. This is the initial visit.  ASSESSMENT AND PLAN / RECOMMENDATIONS:   Advance Care Planning/Goals of Care: Goals include to maximize quality of life and symptom management. Patient/health care surrogate gave his/her permission to discuss.Our advance care planning conversation included a discussion about:    The value and importance of advance care planning  Experiences with loved ones who have been seriously ill or have died  Exploration of personal, cultural or spiritual beliefs that might influence medical decisions  Exploration of goals of care in the event of a sudden injury or illness  Identification and preparation of a healthcare agent  Review and updating or creation of an  advance directive document . Decision not to resuscitate or to de-escalate disease focused treatments due to poor prognosis. CODE STATUS: Full code  Symptom Management/Plan: 1. Advance Care Planning; Full code with extensive discussion with Olivia Martin, Olivia Martin daughter HCPOA shared with her sister Olivia Martin. Olivia Martin and I reviewed MOST form, blank form left. We talked about DNR, Olivia Martin endorses she wants to further discuss with Olivia Martin with  likely changing full code to DNR. Olivia Martin endorses she can call if decision is made prior to next pc visit if not will re-address;   2. Goals of Care: Goals include to maximize quality of life and symptom management. Our advance care planning conversation included a discussion about:    The value and importance of advance care planning  Exploration of personal, cultural or spiritual beliefs that might influence medical decisions  Exploration of goals of care in the event of a sudden injury or illness  Identification and preparation of a healthcare agent  Review and updating or creation of an advance directive document.  3. Palliative care encounter; Palliative care encounter; Palliative medicine team will continue to Martin patient, patient's family, and medical team. Visit consisted of counseling and education dealing with the complex and emotionally intense issues of symptom management and palliative care in the setting of serious and potentially life-threatening illness  4. Debility secondary to dementia, discussed disease progression, walks with walker. We talked about mobility. Olivia Martin endorses Olivia Martin when she goes to bed, she does not get up until the next am.   5.  f/u 2 month for ongoing monitoring chronic disease progression, ongoing discussions complex medical decision making  Follow up Palliative Care Visit: Palliative care will continue to follow for complex medical decision making, advance care planning, and clarification of goals. Return 8 weeks or prn.  I spent 68 minutes providing this consultation. More than 50% of the time in this consultation was spent in counseling and care coordination. PPS: 40%  HOSPICE ELIGIBILITY/DIAGNOSIS: TBD  Chief Complaint: Initial Palliative consult for complex medical decision  making  HISTORY OF PRESENT ILLNESS:  Olivia Martin is a 80 y.o. year old female  with multiple medical problems including Alzheimer's dementia, HLD, depression.  Recent hospitalization 11/23/2020 to 11/27/2020 for cough, sepsis secondary to UTI. I called Olivia Martin, Olivia Martin daughter to confirm pc initial visit and covid screening negative. I visited Olivia Martin and Olivia Martin in her home, she was sitting in the chair in the family room with walker in front of her. We talked about purpose pc visit. We talked about what services PC provides in consultative role. We talked about how Olivia Martin has been feeling. We talked about the last time Olivia Martin was independent without assistance. Olivia Martin is divorced, with 3 adult children. We talked about past medical history, chronic disease dementia. Discussion with Olivia Martin, Olivia Martin daughter HCPOA shared with her sister Olivia Martin. Olivia Martin and I reviewed MOST form, blank form left. We talked about DNR, Olivia Martin endorses she wants to further discuss with Olivia Martin with likely changing full code to DNR. Olivia Martin endorses she can call if decision is made prior to next pc visit if not will re-address; We talked about long term planning. Olivia Martin endorses when Olivia Martin requires 24hrs care then Olivia Martin will move Olivia Martin to her home. 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 We talked about option of PC SW to consult to help look at the policy to see if would be able to access CNA's in the home as currently she does stay by herself at different times of the day. Olivia Martin and Olivia Martin switch off multiple visits throughout the day. Olivia Martin brings her breakfast and lunch. She goes to [place] home for dinner every night. They bring Olivia Martin back to her home, put her to bed where she stays until Olivia Martin comes to get her up in am. Olivia Martin endorses Olivia Martin is able to get up out of a chair, take steps with a walker. Olivia Martin endorses Olivia Martin requires to be bathed, dressed. Olivia Martin is incontient bowel and bladder, in adult diapers. We talked about Olivia Martin. Contact information  provided. She is able to feed herself. Appetite has been good, full meals, possible about 6 lbs weight loss recently. We talked about progression with realistic expectations. We talked about role pc in poc. Life review as Ms. Rasch has daughters, Olivia Martin and Tawni Carnes with son who is mentally ill, lives on the same property as Ms. Arnette Norris and Dana. We talked about f/u pc visit, Olivia Martin in agreement, scheduled. Therapeutic listening, emotional Martin provided. Questions answered.   History obtained from review of EMR, discussion with daughter Olivia Martin and Ms. Reasons.  I reviewed available labs, medications, imaging, studies and related documents from the EMR.  Records reviewed and summarized above.   ROS Full 14 system review of systems performed and negative with exception of: as per HPI.   Physical Exam: Constitutional: NAD General: pleasantly confused female EYES:  lids intact ENMT: oral mucous membranes moist CV: S1S2, RRR Pulmonary: LCTA, no increased work of breathing, no cough, room air Abdomen: normo-active BS + 4 quadrants, soft and non tender MSK: ambulatory with walker Skin: warm and dry Neuro:  + generalized weakness,  + cognitive impairment Psych: non-anxious affect, A and oriented to self  CURRENT PROBLEM LIST:  Patient Active Problem List   Diagnosis Date Noted   Cough    UTI (urinary tract infection) 11/23/2020   Essential hypertension 11/23/2020   Hyperlipidemia 11/23/2020   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 11/23/2020   Depression 11/23/2020   Alzheimer's dementia ([place]) 11/23/2020   Sepsis ([place]) 11/23/2020   Family history of breast cancer 02/13/2014   Lump or mass in breast    PAST MEDICAL HISTORY:  Active Ambulatory Problems    Diagnosis Date Noted   Lump or mass in breast    Family history of breast cancer 02/13/2014   UTI (urinary tract infection) 11/23/2020   Essential hypertension 11/23/2020   Hyperlipidemia 11/23/2020    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 11/23/2020   Depression 11/23/2020   Alzheimer's dementia ([place]) 11/23/2020   Sepsis ([place]) 11/23/2020   Cough    Resolved Ambulatory Problems    Diagnosis Date Noted   No Resolved Ambulatory Problems   Past Medical History:  Diagnosis Date   Allergy    Arthritis    Hypertension    SOCIAL HX:  Social History   Tobacco Use   Smoking status: Former    Packs/day: 1.00    Years: 20.00    Pack years: 20.00    Types: Cigarettes   Smokeless tobacco: Never  Substance Use Topics   Alcohol use: No   FAMILY HX:  Family History  Problem Relation Age of Onset   Cancer Mother        lung cancer, died at age 83   Cancer Brother 72       lung cancer   Cancer Sister 33       breast cancer   Breast cancer Sister 93    reviewed  ALLERGIES:  Allergies  Allergen Reactions   Prednisone Swelling and Other (See Comments)    "felt like I couldn't breathe"     PERTINENT MEDICATIONS:  Outpatient Encounter Medications as of 02/26/2021  Medication Sig   amLODipine (NORVASC) 10 MG tablet Take 10 mg by mouth daily.   aspirin EC 81 MG tablet Take 81 mg by mouth daily.   baclofen (LIORESAL) 10 MG tablet Take 5-10 mg by mouth See admin instructions. Take  tablet (45m) by mouth every morning and take 1 tablet (133m by mouth every night at bedtime   divalproex (DEPAKOTE) 125 MG DR tablet Take 125 mg by mouth 2 (two) times daily.   donepezil (ARICEPT) 10 MG tablet Take 10 mg by mouth at bedtime.   hydrochlorothiazide (HYDRODIURIL) 25 MG tablet Take 25 mg by mouth daily.   losartan (COZAAR) 25 MG tablet Take 25 mg by mouth daily.   meloxicam (MOBIC) 15 MG tablet Take 15 mg by mouth daily.   memantine (NAMENDA) 10 MG tablet Take 10 mg by mouth 2 (two) times daily.   pantoprazole (PROTONIX) 40 MG tablet Take 40 mg by mouth daily.   potassium chloride SA (KLOR-CON) 20 MEQ tablet Take 20 mEq by mouth daily.   rosuvastatin (CRESTOR) 20 MG tablet  Take 20 mg by mouth daily.   sertraline (ZOLOFT) 50 MG tablet Take 25 mg by mouth daily.   tiZANidine (ZANAFLEX) 2 MG tablet Take 2 mg by mouth at bedtime as needed for pain.   No facility-administered encounter medications on file as of 02/26/2021.  Questions and concerns were addressed. The patient/family was encouraged to call with questions and/or concerns. My business card was provided. Provided general Martin and encouragement, no other unmet needs identified   Thank you for the opportunity to participate in the care of Ms. Yoshimura.  The palliative care team will continue to follow. Please call our office at [phone]  if we can be of additional assistance.   This chart was dictated using voice recognition software.  Despite best efforts to proofread,  errors can occur which can change the documentation meaning.   Sherlene Rickel Z Rosangela Fehrenbach, NP ,   COVID-19 PATIENT SCREENING TOOL Asked and negative response unless otherwise noted:  Have you had symptoms of covid, tested positive or been in contact with someone with symptoms/positive test in the past 5-10 days? NO

## 2021-03-06 ENCOUNTER — Telehealth: Payer: Self-pay

## 2021-03-06 NOTE — Telephone Encounter (Signed)
PC SW outreached patients daughter, Olivia Martin, to schedule home visit.   Visit scheduled for Tue 10/25 @12 .

## 2021-03-17 ENCOUNTER — Telehealth: Payer: Self-pay

## 2021-03-17 NOTE — Telephone Encounter (Signed)
INCOMING CALL:  Patients daughter, Olivia Martin, called PC SW to reschedule tomorrows visit due to schedule conflict.  Home visit rescheduled for: 03/26/21 @12  with PC SW\.

## 2021-03-18 ENCOUNTER — Other Ambulatory Visit: Payer: Medicare Other

## 2021-03-26 ENCOUNTER — Other Ambulatory Visit: Payer: Self-pay

## 2021-03-26 ENCOUNTER — Other Ambulatory Visit: Payer: Medicare Other

## 2021-03-26 NOTE — Progress Notes (Signed)
PC SW arrived at patients home for scheduled visit 03/26/21 @12pm . TC made to daughter, - whom visit was scheduled with, prior to entering the home. Daughter shared that she forgot about visit today and needed to reschedule due to no other family member being available to be present for visit. Daufhter will outreach SW to reschedule home visit at a later time.

## 2021-04-22 ENCOUNTER — Other Ambulatory Visit: Payer: Self-pay

## 2021-04-22 ENCOUNTER — Other Ambulatory Visit: Payer: Medicare Other | Admitting: Nurse Practitioner

## 2021-04-22 ENCOUNTER — Telehealth: Payer: Self-pay | Admitting: Nurse Practitioner

## 2021-04-22 NOTE — Telephone Encounter (Signed)
Britta Mccreedy, Ms. Mckissic daughter, called and rescheduled f/u pc visit, completed

## 2021-06-18 ENCOUNTER — Other Ambulatory Visit: Payer: Medicare Other | Admitting: Nurse Practitioner

## 2021-06-18 ENCOUNTER — Other Ambulatory Visit: Payer: Self-pay

## 2021-06-18 ENCOUNTER — Telehealth: Payer: Self-pay | Admitting: Nurse Practitioner

## 2021-06-18 NOTE — Telephone Encounter (Signed)
I received a messgae from Ms. [place] daughter to cancel appointment for tomorrow, will put on reschedule list

## 2021-06-20 ENCOUNTER — Telehealth: Payer: Self-pay

## 2021-06-20 NOTE — Telephone Encounter (Signed)
06/20/21 @2PM : PC SW outreached patients daughter, Hinton Dyer, to reschedule PC visit.  Call unsuccessful. SW LVM requesting call back.

## 2021-08-19 DIAGNOSIS — R001 Bradycardia, unspecified: Secondary | ICD-10-CM | POA: Insufficient documentation

## 2021-10-15 ENCOUNTER — Other Ambulatory Visit (HOSPITAL_COMMUNITY): Payer: Self-pay | Admitting: Radiology

## 2021-10-15 ENCOUNTER — Other Ambulatory Visit (HOSPITAL_COMMUNITY): Payer: Medicare Other

## 2021-10-15 ENCOUNTER — Encounter (HOSPITAL_COMMUNITY): Payer: Self-pay | Admitting: *Deleted

## 2021-10-15 ENCOUNTER — Other Ambulatory Visit: Payer: Self-pay

## 2021-10-15 ENCOUNTER — Emergency Department (HOSPITAL_COMMUNITY): Payer: Medicare Other

## 2021-10-15 ENCOUNTER — Emergency Department (HOSPITAL_COMMUNITY)
Admission: EM | Admit: 2021-10-15 | Discharge: 2021-10-15 | Disposition: A | Discharge status: Home / Self Care | Payer: Medicare Other | Attending: Emergency Medicine | Admitting: Emergency Medicine

## 2021-10-15 DIAGNOSIS — M25511 Pain in right shoulder: Secondary | ICD-10-CM | POA: Diagnosis present

## 2021-10-15 DIAGNOSIS — R531 Weakness: Secondary | ICD-10-CM | POA: Diagnosis not present

## 2021-10-15 DIAGNOSIS — M7531 Calcific tendinitis of right shoulder: Secondary | ICD-10-CM | POA: Diagnosis not present

## 2021-10-15 DIAGNOSIS — F0390 Unspecified dementia without behavioral disturbance: Secondary | ICD-10-CM | POA: Insufficient documentation

## 2021-10-15 DIAGNOSIS — Z7982 Long term (current) use of aspirin: Secondary | ICD-10-CM | POA: Diagnosis not present

## 2021-10-15 LAB — CBC WITH DIFFERENTIAL/PLATELET
Abs Immature Granulocytes: 0.02 10*3/uL (ref 0.00–0.07)
Basophils Absolute: 0.1 10*3/uL (ref 0.0–0.1)
Basophils Relative: 1 %
Eosinophils Absolute: 0.1 10*3/uL (ref 0.0–0.5)
Eosinophils Relative: 1 %
HCT: 43 % (ref 36.0–46.0)
Hemoglobin: 14 g/dL (ref 12.0–15.0)
Immature Granulocytes: 0 %
Lymphocytes Relative: 24 %
Lymphs Abs: 2 10*3/uL (ref 0.7–4.0)
MCH: 32.8 pg (ref 26.0–34.0)
MCHC: 32.6 g/dL (ref 30.0–36.0)
MCV: 100.7 fL — ABNORMAL HIGH (ref 80.0–100.0)
Monocytes Absolute: 0.8 10*3/uL (ref 0.1–1.0)
Monocytes Relative: 9 %
Neutro Abs: 5.4 10*3/uL (ref 1.7–7.7)
Neutrophils Relative %: 65 %
Platelets: 178 10*3/uL (ref 150–400)
RBC: 4.27 MIL/uL (ref 3.87–5.11)
RDW: 12.8 % (ref 11.5–15.5)
WBC: 8.4 10*3/uL (ref 4.0–10.5)
nRBC: 0 % (ref 0.0–0.2)

## 2021-10-15 LAB — URINALYSIS, ROUTINE W REFLEX MICROSCOPIC
Bilirubin Urine: NEGATIVE
Glucose, UA: NEGATIVE mg/dL
Ketones, ur: NEGATIVE mg/dL
Leukocytes,Ua: NEGATIVE
Nitrite: NEGATIVE
Protein, ur: NEGATIVE mg/dL
Specific Gravity, Urine: 1.024 (ref 1.005–1.030)
pH: 6 (ref 5.0–8.0)

## 2021-10-15 LAB — BASIC METABOLIC PANEL
Anion gap: 6 (ref 5–15)
BUN: 26 mg/dL — ABNORMAL HIGH (ref 8–23)
CO2: 26 mmol/L (ref 22–32)
Calcium: 8.7 mg/dL — ABNORMAL LOW (ref 8.9–10.3)
Chloride: 110 mmol/L (ref 98–111)
Creatinine, Ser: 0.69 mg/dL (ref 0.44–1.00)
GFR, Estimated: >60 mL/min (ref >60)
Glucose, Bld: 113 mg/dL — ABNORMAL HIGH (ref 70–99)
Potassium: 3.3 mmol/L — ABNORMAL LOW (ref 3.5–5.1)
Sodium: 142 mmol/L (ref 135–145)

## 2021-10-15 NOTE — Discharge Instructions (Addendum)
As discussed, today's evaluation has been generally reassuring aside from demonstration of likely calcific tendinitis in the right arm please use the medicated patch as we discussed, and Tylenol for additional pain control.  Return here for concerning changes in your condition.

## 2021-10-15 NOTE — ED Notes (Signed)
In and out cath performed; pt tolerated well, family in room; urine specimen sent to lab

## 2021-10-15 NOTE — ED Triage Notes (Signed)
Pt brought in by [facility] for c/o all over body pain; pt has dementia and lives by herself  [id number]  Pt denies any pain

## 2021-10-15 NOTE — ED Notes (Signed)
Ems here for transport

## 2021-10-15 NOTE — Progress Notes (Signed)
AP A[facility]) Hospital Liaison note:  This patient is currently enrolled in [facility] outpatient-based Palliative Care. Will continue to follow for disposition.  Please call with any outpatient palliative questions or concerns.  Thank you, Lorelee Market, LPN [facility] Liaison [phone]

## 2021-10-15 NOTE — ED Provider Notes (Signed)
[facility] EMERGENCY DEPARTMENT Provider Note   CSN: [id number] Arrival date & time: 10/15/21  1020     History  Chief Complaint  Patient presents with   Pain    Olivia Martin is a 81 y.o. female.  HPI Patient with a substantial history of dementia presents via EMS is accompanied by her daughter who provides much of the history. Patient cannot answer any questions with details, level 5 caveat. Seemingly over the past days patient has developed pain in her right upper shoulder, possibly in the right clavicular area.  No reported trauma, fall, patient reportedly sleeps on right side, and has had some protuberance of the right clavicle yesterday, but none today.  Patient is hesitant to move her right upper extremity, seemingly.  With a history of sepsis in the past, urinary tract infection as well family requests evaluation with labs urinalysis.    Home Medications Prior to Admission medications   Medication Sig Start Date End Date Taking? Authorizing Provider  amLODipine (NORVASC) 10 MG tablet Take 5 mg by mouth daily.   Yes [provider]  aspirin EC 81 MG tablet Take 81 mg by mouth daily.   Yes [provider]  baclofen (LIORESAL) 10 MG tablet Take 10 mg by mouth at bedtime.   Yes [provider]  cyanocobalamin 1000 MCG tablet Take 500 mcg by mouth daily.   Yes [provider]  divalproex (DEPAKOTE) 125 MG DR tablet Take 125 mg by mouth 2 (two) times daily.   Yes [provider]  donepezil (ARICEPT) 10 MG tablet Take 10 mg by mouth at bedtime.   Yes [provider]  hydrochlorothiazide (HYDRODIURIL) 25 MG tablet Take 25 mg by mouth daily.   Yes [provider]  losartan (COZAAR) 25 MG tablet Take 25 mg by mouth daily.   Yes [provider]  meloxicam (MOBIC) 15 MG tablet Take 15 mg by mouth daily.   Yes [provider]  memantine (NAMENDA) 10 MG tablet Take 10 mg by mouth 2 (two) times daily.    Yes [provider]  pantoprazole (PROTONIX) 40 MG tablet Take 40 mg by mouth daily.   Yes [provider]  potassium chloride SA (KLOR-CON) 20 MEQ tablet Take 20 mEq by mouth daily.   Yes [provider]  rosuvastatin (CRESTOR) 20 MG tablet Take 20 mg by mouth daily.   Yes [provider]  sertraline (ZOLOFT) 50 MG tablet Take 25 mg by mouth daily.   Yes [provider]  tiZANidine (ZANAFLEX) 2 MG tablet Take 2 mg by mouth at bedtime as needed for pain.   Yes [provider]      Allergies    Prednisone    Review of Systems   Review of Systems  Unable to perform ROS: Dementia   Physical Exam Updated Vital Signs BP 132/68   Pulse 60   Temp 98.2 F (36.8 C) (Oral)   Resp 15   Ht 5\' 7"  (1.702 m)   Wt 92 kg   SpO2 96%   BMI 31.77 kg/m  Physical Exam Vitals and nursing note reviewed.  Constitutional:      General: She is not in acute distress.    Appearance: She is well-developed. She is obese. She is not ill-appearing, toxic-appearing or diaphoretic.  HENT:     Head: Normocephalic and atraumatic.  Eyes:     Conjunctiva/sclera: Conjunctivae normal.  Cardiovascular:     Rate and Rhythm: Normal rate and  regular rhythm.  Pulmonary:     Effort: Pulmonary effort is normal. No respiratory distress.     Breath sounds: Normal breath sounds. No stridor.  Abdominal:     General: There is no distension.  Musculoskeletal:       Arms:  Skin:    General: Skin is warm and dry.  Neurological:     Mental Status: She is alert.     Cranial Nerves: No cranial nerve deficit.     Motor: Tremor and atrophy present.  Psychiatric:        Mood and Affect: Mood normal.    ED Results / Procedures / Treatments   Labs (all labs ordered are listed, but only abnormal results are displayed) Labs Reviewed  CBC WITH DIFFERENTIAL/PLATELET - Abnormal; Notable for the following components:      Result Value   MCV 100.7 (*)    All other  components within normal limits  BASIC METABOLIC PANEL - Abnormal; Notable for the following components:   Potassium 3.3 (*)    Glucose, Bld 113 (*)    BUN 26 (*)    Calcium 8.7 (*)    All other components within normal limits  URINALYSIS, ROUTINE W REFLEX MICROSCOPIC - Abnormal; Notable for the following components:   Hgb urine dipstick SMALL (*)    Bacteria, UA RARE (*)    All other components within normal limits    EKG None  Radiology DG Chest 2 View  Result Date: 10/15/2021 CLINICAL DATA:  Right shoulder and upper right chest pain EXAM: CHEST - 2 VIEW COMPARISON:  11/24/2020 chest radiograph. FINDINGS: Stable cardiomediastinal silhouette with normal heart size. No pneumothorax. No pleural effusion. Lungs appear clear, with no acute consolidative airspace disease and no pulmonary edema. IMPRESSION: No active cardiopulmonary disease. Electronically Signed   By: Delbert PhenixJason A Poff M.D.   On: 10/15/2021 12:39   DG Shoulder Right  Result Date: 10/15/2021 CLINICAL DATA:  Right shoulder pain EXAM: RIGHT SHOULDER - 2+ VIEW COMPARISON:  None Available. FINDINGS: Normal alignment no fracture. Small soft tissue calcification the great adjacent to the greater tuberosity compatible with calcific tendinitis Degenerative change in the [facility] joint with spurring. IMPRESSION: Negative for fracture Degenerative change in the [facility] joint. Mild calcific tendinitis in the rotator cuff. Electronically Signed   By: Marlan Palauharles  Clark M.D.   On: 10/15/2021 12:40    Procedures Procedures    Medications Ordered in ED Medications - No data to display  ED Course/ Medical Decision Making/ A&P This patient with a Hx of dementia, prior episode of sepsis presents to the ED for concern of right upper shoulder, chest wall discomfort, possibly as well as malodorous urine, this involves an extensive number of treatment options, and is a complaint that carries with it a high risk of complications and morbidity.    The  differential diagnosis includes musculoskeletal injuries for the former, less likely bacteremia, sepsis, urinary tract infection or dehydration with the latter   Social Determinants of Health:  Dementia, age, relative immobility  Additional history obtained:  Additional history and/or information obtained from daughter, EMS, notable for per EMS CBG 136, per daughter details of HPI as above   After the initial evaluation, orders, including: Labs x-rays were initiated.   Patient placed on Cardiac and Pulse-Oximetry Monitors. The patient was maintained on a cardiac monitor.  The cardiac monitored showed an rhythm of 70 sinus normal The patient was also maintained on pulse oximetry. The readings were typically 100% room air normal  On repeat evaluation of the patient stayed the same  Lab Tests:  I personally interpreted labs.  The pertinent results include: BUN slightly greater than 20, otherwise unremarkable, no substantial dehydration, no evidence for renal dysfunction, no urinary tract infection  Imaging Studies ordered:  I independently visualized and interpreted imaging which showed no fracture on x-ray, but x-ray does demonstrate likely calcific tendinitis, right biceps, consistent with the patient's physical exam I agree with the radiologist interpretation   Dispostion / Final MDM:  After consideration of the diagnostic results and the patient's response to treatment, patient continues to be smiling, pleasant, in no distress, remains hemodynamically unremarkable, after hours of monitoring here.  Labs discussed at length with the patient's daughter and granddaughter, the latter of whom is her primary caregiver.  Repeat exam patient continues to have some hesitancy to flex the shoulder consistent with calcific tendinitis.  No evidence for fracture, hemodynamic instability, or other acute findings.  In regards to the patient's prior episode of sepsis and urinary tract infection, no  evidence for either of these entities today, no evidence for bacteremia either.  We will length conversation about calcific tendinitis, options for therapy, the patient will obtain and use topical medicated patches including NSAID, will follow-up with outpatient clinicians.  Final Clinical Impression(s) / ED Diagnoses Final diagnoses:  Weakness  Calcific tendonitis of right shoulder    Rx / DC Orders ED Discharge Orders     None         Gerhard Munch, MD 10/15/21 [phone]

## 2021-10-16 ENCOUNTER — Other Ambulatory Visit: Payer: Medicare Other | Admitting: Nurse Practitioner

## 2021-10-16 ENCOUNTER — Telehealth: Payer: Self-pay | Admitting: Nurse Practitioner

## 2021-10-16 ENCOUNTER — Encounter: Payer: Self-pay | Admitting: Nurse Practitioner

## 2021-10-16 DIAGNOSIS — R5381 Other malaise: Secondary | ICD-10-CM

## 2021-10-16 DIAGNOSIS — Z515 Encounter for palliative care: Secondary | ICD-10-CM

## 2021-10-16 DIAGNOSIS — F0280 Dementia in other diseases classified elsewhere without behavioral disturbance: Secondary | ICD-10-CM

## 2021-10-16 NOTE — Telephone Encounter (Signed)
Rec'd return call from daughter Annabelle Harman and have scheduled a Telehealth Palliative f/u visit for today, 10/16/21 @ 1:30 PM

## 2021-10-16 NOTE — Progress Notes (Signed)
Therapist, nutritional Palliative Care Consult Note Telephone: [phone]  Fax: [phone]    Date of encounter: 10/16/21 2:26 PM PATIENT NAME: Olivia Martin [address] [place] [address]   [phone] (home)  DOB: February 12, 1941 MRN: [id number] PRIMARY CARE PROVIDER:    [name], MD,  [address] [place] [address] [phone] RESPONSIBLE PARTY:    Contact Information     Name Relation Home Work Mobile   Hewitt,Dana Daughter   [phone]   leigh,barbara Daughter [phone]        Due to the COVID-19 crisis, this visit was done via telemedicine from my office and it was initiated and consent by this patient and or family.  I connected with Olivia Martin, daughter with  Olivia Martin OR PROXY on 10/16/21 by telephone as video not available enabled telemedicine application and verified that I am speaking with the correct person.   I discussed the limitations of evaluation and management by telemedicine. The patient expressed understanding and agreed to proceed.  Palliative Care was asked to follow this patient by consultation request of  [name], MD to address advance care planning and complex medical decision making. This is a follow up visit.                                  ASSESSMENT AND PLAN / RECOMMENDATIONS:  Symptom Management/Plan: 1. Advance Care Planning; Full code will revisit with Olivia Martin with in person visit    2. Goals of Care: Goals include to maximize quality of life and symptom management. Our advance care planning conversation included a discussion about:    The value and importance of advance care planning  Exploration of personal, cultural or spiritual beliefs that might influence medical decisions  Exploration of goals of care in the event of a sudden injury or illness  Identification and preparation of a healthcare agent  Review and updating or creation of an advance directive document.   3.  Palliative care encounter; Palliative care encounter; Palliative medicine team will continue to support patient, patient's family, and medical team. Visit consisted of counseling and education dealing with the complex and emotionally intense issues of symptom management and palliative care in the setting of serious and potentially life-threatening illness   4. Debility secondary to dementia, discussed disease progression, with decreased mobility, Olivia Martin is now requiring to be lifted from bed to w/c, bathed, dressed and assisted with feeding with good appetite. We talked about Olivia Martin living at her home with her grandson staying with her at night and 2 daughters caring for her during the day. We talked about it is becoming more difficult getting in and out of bed, discussed hospital bed, though Olivia Martin was going to look into a different kind of bed that the head and foot raises. We talked about debility, difficulty getting to primary office and option of in home primary care through [facility]. Olivia Martin endorses she will further discuss with her sister and Dr Burnett Sheng with next primary visit in a few weeks.    5.  f/u 2 weeks for ongoing monitoring chronic disease progression, ongoing discussions complex medical decision making Follow up Palliative Care Visit: Palliative care will continue to follow for complex medical decision making, advance care planning, and clarification of goals. Return 2 weeks or prn.  I spent 31 minutes providing this consultation. More than 50% of the  time in this consultation was spent in counseling and care coordination. PPS: 40% Chief Complaint: Follow up palliative consult for complex medical decision making HISTORY OF PRESENT ILLNESS:  [name] is a 47 y.o. year old female  with Alzheimer's dementia, HLD, depression. I called Olivia Martin for telephonic telemedicine as video is not available f/u pc visit. We talked about purpose of pc visit, how Olivia Martin has been  doing, discussed ros, symptoms, recent ED visit, functional and cognitive changes. We talked about Olivia Martin living situation, caregivers staying with her, family dynamics. We talked about resources possible available as it has been difficult with multiple family members sick, taking care of grandchildren and working. We talked about medical goals, f/u pc visit. We talked about having PC SW contact about option of resources further discuss LTC planning. Therapeutic listening emotional support provided, questions answered.   History obtained from review of EMR, discussion with Olivia Martin, daughter with Olivia Martin.  I reviewed available labs, medications, imaging, studies and related documents from the EMR.  Records reviewed and summarized above.   ROS 10 point system reviewed all negative except HPI  Physical Exam: deferred Thank you for the opportunity to participate in the care of Olivia Martin.  The palliative care team will continue to follow. Please call our office at [phone] if we can be of additional assistance.   Melaina Howerton Prince Rome, NP

## 2021-10-16 NOTE — Telephone Encounter (Signed)
Attempted to contact patient's daughter Annabelle Harman to schedule a MyChart Palliative f/u visit for this afternoon, no answer - left message requesting a return call to let me know if this would work for them.

## 2021-10-22 ENCOUNTER — Other Ambulatory Visit: Payer: Medicare Other

## 2021-10-22 DIAGNOSIS — Z515 Encounter for palliative care: Secondary | ICD-10-CM

## 2021-10-22 NOTE — Progress Notes (Signed)
PATIENT NAME: Olivia Martin DOB: [birth year] MRN: [id number]  PRIMARY CARE PROVIDER: [name], MD  RESPONSIBLE PARTY:  Acct ID - Guarantor Home Phone Work Phone Relationship Acct Type  [id number] Olivia Martin, Olivia Martin* [phone]  Self P/F     [address], [place], [place] [address]    Daughter Annabelle Harman contacted [facility] over the weekend regarding skin breakdown to patient's right shoulder and left heel.  Return call made and spoke with Annabelle Harman regarding skin breakdown.  Blister to shoulder has opened and drained.  ABT ointment is being applied and area is healing per Annabelle Harman.  Confidential/HIPPA compliant picture sent of heel.   Heel is very concerning as blistered area has opened and heel is becoming soft when gently pressed.  Annabelle Harman advised she spoke with pharmacy who recommended cleaning with hydrogen peroxide and vaseline or abt ointment to the area 2 x daily.  She does not feel this is improving.  We discussed follow up with PCP and they are scheduled to see on June 13.  Annabelle Harman advised it is becoming harder to get patient out of the home.  She has limited mobility and is now requiring two people to assist her in standing. We discussed a home care provider seeing patient and possibly HH nursing to address the wound.  Annabelle Harman advised she would like to see PCP one more time if possible and then switch to a home care provider.  She advised this may not be possible and she may need to see a home care provider now.  Advised that I would follow up with Christin Gusler, NP.  NP notified of above and recommendations received to see PCP this week, ED visit, or refer to [facility].  Discussed with Annabelle Harman options and she would like to proceed with [facility]. Referral sent to [facility] and advised of situation with the heel and need for urgent visit.    Truitt Merle, RN

## 2021-10-23 ENCOUNTER — Telehealth: Payer: Self-pay

## 2021-10-23 NOTE — Telephone Encounter (Signed)
PC SW outreached patients daughter, Annabelle Harman, per [facility] NP - C. Gusler request to follow up on patient needs in regard to caregiver support.  Patients daughter shared that they are not in immediate need of caregiver support, but would like to have caregiver agency resources for future resources to have for possible respite coverage at home.   SW to email daughter, Annabelle Harman at [email or web address], the following resources:   1. [facility] [phone]  Lifetime[employer]ors.[email or web address]     2. [facility] [phone]  [email or web address]/[place]/?utm_source=GMB&utm_medium=organic&utm_campaign=[place]     3. [facility] [phone]  [email or web address]     4. [facility] [phone]  Cust[employer]ianSupply.fi     [facility] Lifespan Respite Program  The [place] Lifespan Respite Program is an application-based program that reimburses eligible family caregivers caring for individuals of any age for up to $500 in respite care services in a calendar year. Funding is limited, and applications are accepted only when funds are available.    Application/Apply: [email or web address]://[email or web address]://www.adams-[email or web address]/     [employer] (Caregiver Alternatives to Running on Empty)  Offering persons with dementia and their caregivers counseling, care consultation, dementia-specific information, caregiver assessments, caregiver education, respite care, and connections to social support networks. Provided by [facility] Division of Aging and Adult Services (DAAS) BeautyRebate.co.nz   [place] Office  Phone: [phone] or [phone]  Physical Address: [facility]  [address], [place] [address]

## 2021-11-05 ENCOUNTER — Other Ambulatory Visit: Payer: Medicare Other | Admitting: Nurse Practitioner

## 2021-11-05 ENCOUNTER — Encounter: Payer: Self-pay | Admitting: Nurse Practitioner

## 2021-11-05 DIAGNOSIS — Z515 Encounter for palliative care: Secondary | ICD-10-CM

## 2021-11-05 DIAGNOSIS — F0280 Dementia in other diseases classified elsewhere without behavioral disturbance: Secondary | ICD-10-CM

## 2021-11-05 DIAGNOSIS — R634 Abnormal weight loss: Secondary | ICD-10-CM

## 2021-11-05 DIAGNOSIS — R5381 Other malaise: Secondary | ICD-10-CM

## 2021-11-05 NOTE — Progress Notes (Signed)
Designer, jewellery Palliative Care Consult Note Telephone: [phone]  Fax: [phone]    Date of encounter: 11/05/21 4:51 PM PATIENT NAME: Olivia Martin [address] [address]   [phone] (home)  DOB: [birth year] MRN: [id number] PRIMARY CARE PROVIDER:    Maryland Pink, MD,  [address] [place] [address] [phone] RESPONSIBLE PARTY:    Contact Information     Name Relation Home Work Mobile   Olivia Martin,Olivia Martin Daughter   [phone]   Olivia Martin,Olivia Martin Daughter [phone]        I met face to face with patient and family in home. Palliative Care was asked to follow this patient by consultation request of  Maryland Pink, MD to address advance care planning and complex medical decision making. This is a follow up visit.                                  ASSESSMENT AND PLAN / RECOMMENDATIONS:  Symptom Management/Plan: 1. Advance Care Planning; Full code with treat what is treatable.  2. Dementia with progression discussed with expectation as disease progresses. We talked about cognitive impairment, stages of dementia in correlation to Olivia Martin.    3. Weight loss of 3 lbs discussed nutrition, education completed.   4. Palliative care encounter; Palliative care encounter; Palliative medicine team will continue to support patient, patient's family, and medical team. Visit consisted of counseling and education dealing with the complex and emotionally intense issues of symptom management and palliative care in the setting of serious and potentially life-threatening illness   5. Debility secondary to dementia, discussed disease progression, expectations. We talked about mobility. Fall precautions    6.  f/u 6 weeks for phone visit, then 12 weeks for in person visit for ongoing monitoring chronic disease progression, ongoing discussions complex medical decision making   Follow up Palliative Care Visit: Palliative care  will continue to follow for complex medical decision making, advance care planning, and clarification of goals. Return 8 weeks or prn.   I spent 64 minutes providing this consultation. More than 50% of the time in this consultation was spent in counseling and care coordination. PPS: 40%   HOSPICE ELIGIBILITY/DIAGNOSIS: TBD   Chief Complaint: Initial Palliative consult for complex medical decision making   HISTORY OF PRESENT ILLNESS:  OTTO CARAWAY is a 81 y.o. year old female  with multiple medical problems including Alzheimer's dementia, HLD, depression. Recent hospitalization 11/23/2020 to 11/27/2020 for cough, sepsis secondary to UTI. I called Olivia Martin, Olivia Martin daughter to confirm pc initial visit and covid screening negative. I visited [place] and Olivia Martin in Martin home, she was sitting in the chair in the family room with walker in front of Martin. We talked about purpose pc visit. We talked about what services PC provides in consultative role. We talked about how Olivia Martin has been feeling. ROS, symptoms reviewed. No new changes. Olivia Martin has had a good appetite with weight loss of 3 lbs with recent primary provider visit with Dr Kary Kos. We talked about nutrition, foods, textures, what aspiration would look like, education completed. Olivia Martin endorses she and Olivia Martin bring Olivia Martin meals throughout the day. We talked about functional abilities, max lift to standing position, able to take steps with walker; total ADL care including bathing, dressing, incontinence. We talked about medical goals. We talked about wishes to keep Olivia Martin at home.  Olivia Martin endorses Martin grandson lives with Martin, stays with Martin at night, during the day Olivia Martin and Martin sister Olivia Martin visit frequently throughout the day. We talked about option of camera's. We talked about role pc in poc. We talked about f/u pc visit, scheduled. Olivia Martin endorses they will proceed with [facility] for primary care. Therapeutic listening,  emotional support provided. Questions answered.    History obtained from review of EMR, discussion with daughter Olivia Martin and Olivia Martin.  I reviewed available labs, medications, imaging, studies and related documents from the EMR.  Records reviewed and summarized above.    ROS Full 10 system review of systems performed and negative with exception of: as per HPI.    Physical Exam: Constitutional: NAD General: pleasantly confused female EYES:  lids intact ENMT: oral mucous membranes moist CV: S1S2, RRR Pulmonary: LCTA, no increased work of breathing, no cough, room air Abdomen: normo-active BS + 4 quadrants, soft and non tender MSK: ambulatory with walker Skin: warm and dry Neuro:  + generalized weakness,  + cognitive impairment Psych: non-anxious affect, A and oriented to self Thank you for the opportunity to participate in the care of Olivia Martin.  The palliative care team will continue to follow. Please call our office at [phone] if we can be of additional assistance.   Kirstein Baxley Z Pegi Milazzo, NP   COVID-19 PATIENT SCREENING TOOL Asked and negative response unless otherwise noted:   Have you had symptoms of covid, tested positive or been in contact with someone with symptoms/positive test in the past 5-10 days? no

## 2021-12-16 ENCOUNTER — Encounter: Payer: Self-pay | Admitting: Nurse Practitioner

## 2021-12-16 ENCOUNTER — Telehealth: Payer: Medicare Other | Admitting: Nurse Practitioner

## 2021-12-16 DIAGNOSIS — R5381 Other malaise: Secondary | ICD-10-CM

## 2021-12-16 DIAGNOSIS — Z515 Encounter for palliative care: Secondary | ICD-10-CM

## 2021-12-16 DIAGNOSIS — R634 Abnormal weight loss: Secondary | ICD-10-CM

## 2021-12-16 DIAGNOSIS — F0280 Dementia in other diseases classified elsewhere without behavioral disturbance: Secondary | ICD-10-CM

## 2021-12-16 NOTE — Progress Notes (Signed)
Therapist, nutritional Palliative Care Consult Note Telephone: [phone]  Fax: [phone]    Date of encounter: 12/16/21 1:18 PM PATIENT NAME: Olivia Martin [address] [place] [address]   [phone] (home)  DOB: 1940/11/29 MRN: [id number] PRIMARY CARE PROVIDER:    [name], MD,  [address] [place] [address] [phone] RESPONSIBLE PARTY:    Contact Information     Name Relation Home Work Mobile   Hewitt,Dana Daughter   [phone]   leigh,barbara Daughter [phone]         Due to the COVID-19 crisis, this visit was done via telemedicine from my office and it was initiated and consent by this patient and or family.  I connected with Annabelle Harman, Ms. Mello daughter with JENAE TOMASELLO OR PROXY on 12/16/21 by a telephone as video not available enabled telemedicine application and verified that I am speaking with the correct person using two identifiers.   I discussed the limitations of evaluation and management by telemedicine. The patient expressed understanding and agreed to proceed.  Palliative Care was asked to follow this patient by consultation request of  [name], MD to address advance care planning and complex medical decision making. This is a follow up visit.                                   ASSESSMENT AND PLAN / RECOMMENDATIONS:  Symptom Management/Plan: 1. Advance Care Planning; Full code with treat what is treatable.   2. Dementia with progression discussed with expectation as disease progresses. We talked about cognitive impairment, functional decline overall with stages of dementia in correlation to Ms. Berens.     3. Weight loss secondary to dementia, continue to monitor weights, measurements as able, difficulty with standing. Discussed nutrition, seems to have stabilized with increase in appetite.    4. Palliative care encounter; Palliative care encounter; Palliative medicine team will  continue to support patient, patient's family, and medical team. Visit consisted of counseling and education dealing with the complex and emotionally intense issues of symptom management and palliative care in the setting of serious and potentially life-threatening illness   5. Debility secondary to dementia, discussed disease progression, expectations. We talked about mobility. Fall precautions  Follow up Palliative Care Visit: Palliative care will continue to follow for complex medical decision making, advance care planning, and clarification of goals. Return 8 weeks or prn.   I spent 34 minutes providing this consultation. More than 50% of the time in this consultation was spent in counseling and care coordination. PPS: 40% Chief Complaint: Initial Palliative consult for complex medical decision making   HISTORY OF PRESENT ILLNESS:  JAYCEE MCKELLIPS is a 81 y.o. year old female  with multiple medical problems including Alzheimer's dementia, HLD, depression. Recent hospitalization 11/23/2020 to 11/27/2020 for cough, sepsis secondary to UTI. I called Annabelle Harman, Ms. Jasko daughter for telephonic, telemedicine visit as video not available. We talked about last PC visit, continues overall decline functionally and cognitively. Annabelle Harman endorses becoming harder to bath, move Ms. Roblero. We talked about in coming caregiver help, will ask PC SW to contact for more available resources, Annabelle Harman aware likely private pay. We talked about ros, appetite has been stable, does not appear to have lost weight. Nutrition discussed, no recent falls, hospitalizations, infections. We talked about overall cognitive decline with some behaviors starting with resistance to care though  family able to redirect, appears verbal. We talked about coping strategies. Discussed should Ms. Eudy increase in behaviors different medications available. Discussed will continue to monitor, follow, supportive call. Therapeutic listening, emotional  support provided. Questions answered. F/u PC visit scheduled   History obtained from review of EMR, discussion with daughter Annabelle Harman and Ms. Davlin.  I reviewed available labs, medications, imaging, studies and related documents from the EMR.  Records reviewed and summarized above.    ROS Full 10 system review of systems performed and negative with exception of: as per HPI.    Physical Exam: Deferred Thank you for the opportunity to participate in the care of Ms. Morawski.  The palliative care team will continue to follow. Please call our office at [phone] if we can be of additional assistance.   Kennidee Heyne Prince Rome, NP

## 2021-12-16 NOTE — Addendum Note (Signed)
Addended by: Ivery Quale on: 12/16/2021 01:28 PM   Modules accepted: Level of Service

## 2021-12-18 ENCOUNTER — Ambulatory Visit: Payer: Medicare Other

## 2021-12-18 DIAGNOSIS — Z789 Other specified health status: Secondary | ICD-10-CM

## 2021-12-18 NOTE — Progress Notes (Signed)
Palliative care SW outreached patient/family via telephone.   PC SW outreached patient/family per [facility] NP - C. Gusler, SW referral request, to provide private caregiver information.  SW spoke with patients daughter, Olivia Martin, whom shared that she and her sister are in need of additional in home caregiver support for assistance with bathing patient a few times a week as she and her sister are not able to physeally assist with this task.   SW re-sent e-mail to daughter with list of caregiver agencies as well as respite grant programs she can apply for on behalf of patient. Daughter acknowledged receipt of email. SW advised daughter to outreach SW if in need of private caregivers not affiliated with an agency.   Patient does not qualify for Medicaid to assist with funding caregivers.

## 2022-02-10 ENCOUNTER — Other Ambulatory Visit: Payer: Medicare Other | Admitting: Nurse Practitioner

## 2022-03-05 ENCOUNTER — Encounter: Payer: Self-pay | Admitting: Nurse Practitioner

## 2022-03-05 ENCOUNTER — Other Ambulatory Visit: Payer: Medicare Other | Admitting: Nurse Practitioner

## 2022-03-05 DIAGNOSIS — R5381 Other malaise: Secondary | ICD-10-CM

## 2022-03-05 DIAGNOSIS — F0280 Dementia in other diseases classified elsewhere without behavioral disturbance: Secondary | ICD-10-CM

## 2022-03-05 DIAGNOSIS — Z515 Encounter for palliative care: Secondary | ICD-10-CM

## 2022-03-05 NOTE — Progress Notes (Signed)
Therapist, nutritional Palliative Care Consult Note Telephone: [phone]  Fax: [phone]    Date of encounter: 03/05/22 2:09 PM PATIENT NAME: Olivia Martin [address] [place] [address]   [phone] (home)  DOB: 04-14-1941 MRN: [id number] PRIMARY CARE PROVIDER:    [name], MD,  [address] [place] [address] [phone]  REFERRING PROVIDER:   [name], MD [address] [place],  [place] [address] [phone]  RESPONSIBLE PARTY:    Contact Information     Name Relation Home Work Mobile   Hewitt,Dana Daughter   [phone]   leigh,barbara Daughter [phone]         Due to the COVID-19 crisis, this visit was done via telemedicine from my office and it was initiated and consent by this patient and or family.   I connected with Annabelle Harman, Ms. Happ daughter with KYILEE GREGG [facility] [employer] on 12/16/21 by a telephone as video not available enabled telemedicine application and verified that I am speaking with the correct person using two identifiers.   I discussed the limitations of evaluation and management by telemedicine. The patient expressed understanding and agreed to proceed.  Palliative Care was asked to follow this patient by consultation request of  [name], MD to address advance care planning and complex medical decision making. This is a follow up visit.                                   ASSESSMENT AND PLAN / RECOMMENDATIONS:  Symptom Management/Plan: 1. Advance Care Planning; Full code with treat what is treatable.  PC NP signing off as currently Ms. Cephas is stable with hope PC RN/PC SW will be able to continue to follow with check in's for needs. Annabelle Harman verbalized understanding.    2. Dementia with progression discussed with expectation as disease progresses. We talked about cognitive impairment, functional decline overall with stages of dementia in correlation to Ms.  Martin.     3. Weight loss secondary to dementia, stable continue to monitor weights, measurements as able, difficulty with standing. Discussed nutrition, seems to have stabilized with increase in appetite.    4. Palliative care encounter; Palliative care encounter; Palliative medicine team will continue to support patient, patient's family, and medical team. Visit consisted of counseling and education dealing with the complex and emotionally intense issues of symptom management and palliative care in the setting of serious and potentially life-threatening illness   5. Debility secondary to dementia, discussed disease progression, expectations. We talked about mobility. Fall precautions    I spent 25 minutes providing this consultation. More than 50% of the time in this consultation was spent in counseling and care coordination. PPS: 40% Chief Complaint: Initial Palliative consult for complex medical decision making   HISTORY OF PRESENT ILLNESS:  Olivia Martin is a 81 y.o. year old female  with multiple medical problems including Alzheimer's dementia, HLD, depression. Recent hospitalization 11/23/2020 to 11/27/2020 for cough, sepsis secondary to UTI. I called Annabelle Harman, Ms. Justo daughter for telephonic, telemedicine visit as video not available. We talked about how Olivia Martin has been doing, overall decline functionally, cognitively, continues with good appetite. No noted weight loss. No recent falls, behaviors have improved. No recent infection. We talked about medical goal of care. We talked about PC NP signing off as currently Olivia Martin is stable with hope PC  RN/PC SW will be able to continue to follow with check in's for needs. Olivia Martin verbalized understanding.  Discussed will continue to monitor, follow, supportive call. Therapeutic listening, emotional support provided. Questions answered. F/u PC visit scheduled   History obtained from review of EMR, discussion with daughter Olivia Martin with Ms.  Martin, as Olivia Martin is cognitive impaired.  I reviewed available labs, medications, imaging, studies and related documents from the EMR.  Records reviewed and summarized above.    ROS Full 10 system review of systems performed and negative with exception of: as per HPI.    Physical Exam: Deferred  Thank you for the opportunity to participate in the care of Olivia Martin.  The palliative care team will continue to follow. Please call our office at [phone] if we can be of additional assistance.   Leanny Moeckel Ihor Gully, NP

## 2022-05-06 ENCOUNTER — Other Ambulatory Visit: Payer: Medicare Other

## 2024-03-15 ENCOUNTER — Emergency Department

## 2024-03-15 ENCOUNTER — Inpatient Hospital Stay
Admission: EM | Admit: 2024-03-15 | Discharge: 2024-03-20 | DRG: 159 | Disposition: A | Discharge status: Home / Self Care | Attending: Obstetrics and Gynecology | Admitting: Obstetrics and Gynecology

## 2024-03-15 ENCOUNTER — Other Ambulatory Visit: Payer: Self-pay

## 2024-03-15 DIAGNOSIS — Z791 Long term (current) use of non-steroidal anti-inflammatories (NSAID): Secondary | ICD-10-CM

## 2024-03-15 DIAGNOSIS — I10 Essential (primary) hypertension: Secondary | ICD-10-CM | POA: Diagnosis not present

## 2024-03-15 DIAGNOSIS — Z6831 Body mass index (BMI) 31.0-31.9, adult: Secondary | ICD-10-CM

## 2024-03-15 DIAGNOSIS — Z79899 Other long term (current) drug therapy: Secondary | ICD-10-CM

## 2024-03-15 DIAGNOSIS — K122 Cellulitis and abscess of mouth: Secondary | ICD-10-CM | POA: Diagnosis not present

## 2024-03-15 DIAGNOSIS — T8089XA Other complications following infusion, transfusion and therapeutic injection, initial encounter: Secondary | ICD-10-CM | POA: Diagnosis not present

## 2024-03-15 DIAGNOSIS — E66811 Obesity, class 1: Secondary | ICD-10-CM | POA: Diagnosis present

## 2024-03-15 DIAGNOSIS — E785 Hyperlipidemia, unspecified: Secondary | ICD-10-CM | POA: Diagnosis not present

## 2024-03-15 DIAGNOSIS — E876 Hypokalemia: Secondary | ICD-10-CM | POA: Diagnosis not present

## 2024-03-15 DIAGNOSIS — E669 Obesity, unspecified: Secondary | ICD-10-CM | POA: Diagnosis present

## 2024-03-15 DIAGNOSIS — L8990 Pressure ulcer of unspecified site, unspecified stage: Secondary | ICD-10-CM | POA: Insufficient documentation

## 2024-03-15 DIAGNOSIS — F02C0 Dementia in other diseases classified elsewhere, severe, without behavioral disturbance, psychotic disturbance, mood disturbance, and anxiety: Secondary | ICD-10-CM | POA: Diagnosis present

## 2024-03-15 DIAGNOSIS — Z87440 Personal history of urinary (tract) infections: Secondary | ICD-10-CM

## 2024-03-15 DIAGNOSIS — L89152 Pressure ulcer of sacral region, stage 2: Secondary | ICD-10-CM | POA: Diagnosis present

## 2024-03-15 DIAGNOSIS — Z515 Encounter for palliative care: Secondary | ICD-10-CM

## 2024-03-15 DIAGNOSIS — Y848 Other medical procedures as the cause of abnormal reaction of the patient, or of later complication, without mention of misadventure at the time of the procedure: Secondary | ICD-10-CM | POA: Diagnosis not present

## 2024-03-15 DIAGNOSIS — Z90710 Acquired absence of both cervix and uterus: Secondary | ICD-10-CM

## 2024-03-15 DIAGNOSIS — Z96653 Presence of artificial knee joint, bilateral: Secondary | ICD-10-CM | POA: Diagnosis present

## 2024-03-15 DIAGNOSIS — Z7401 Bed confinement status: Secondary | ICD-10-CM

## 2024-03-15 DIAGNOSIS — L0201 Cutaneous abscess of face: Secondary | ICD-10-CM

## 2024-03-15 DIAGNOSIS — G309 Alzheimer's disease, unspecified: Secondary | ICD-10-CM | POA: Diagnosis present

## 2024-03-15 DIAGNOSIS — F0280 Dementia in other diseases classified elsewhere without behavioral disturbance: Secondary | ICD-10-CM | POA: Diagnosis present

## 2024-03-15 DIAGNOSIS — Z7982 Long term (current) use of aspirin: Secondary | ICD-10-CM

## 2024-03-15 DIAGNOSIS — F32A Depression, unspecified: Secondary | ICD-10-CM | POA: Diagnosis present

## 2024-03-15 DIAGNOSIS — Z87891 Personal history of nicotine dependence: Secondary | ICD-10-CM

## 2024-03-15 DIAGNOSIS — M1990 Unspecified osteoarthritis, unspecified site: Secondary | ICD-10-CM | POA: Diagnosis present

## 2024-03-15 DIAGNOSIS — Z66 Do not resuscitate: Secondary | ICD-10-CM | POA: Diagnosis present

## 2024-03-15 DIAGNOSIS — R221 Localized swelling, mass and lump, neck: Secondary | ICD-10-CM | POA: Diagnosis not present

## 2024-03-15 LAB — CBC WITH DIFFERENTIAL/PLATELET
Abs Immature Granulocytes: 0.05 K/uL (ref 0.00–0.07)
Basophils Absolute: 0.1 K/uL (ref 0.0–0.1)
Basophils Relative: 1 %
Eosinophils Absolute: 0.1 K/uL (ref 0.0–0.5)
Eosinophils Relative: 1 %
HCT: 45 % (ref 36.0–46.0)
Hemoglobin: 15 g/dL (ref 12.0–15.0)
Immature Granulocytes: 0 %
Lymphocytes Relative: 22 %
Lymphs Abs: 2.7 K/uL (ref 0.7–4.0)
MCH: 32 pg (ref 26.0–34.0)
MCHC: 33.3 g/dL (ref 30.0–36.0)
MCV: 95.9 fL (ref 80.0–100.0)
Monocytes Absolute: 1.2 K/uL — ABNORMAL HIGH (ref 0.1–1.0)
Monocytes Relative: 10 %
Neutro Abs: 7.9 K/uL — ABNORMAL HIGH (ref 1.7–7.7)
Neutrophils Relative %: 66 %
Platelets: 316 K/uL (ref 150–400)
RBC: 4.69 MIL/uL (ref 3.87–5.11)
RDW: 12.5 % (ref 11.5–15.5)
WBC: 12 K/uL — ABNORMAL HIGH (ref 4.0–10.5)
nRBC: 0 % (ref 0.0–0.2)

## 2024-03-15 LAB — MAGNESIUM: Magnesium: 2.4 mg/dL (ref 1.7–2.4)

## 2024-03-15 LAB — COMPREHENSIVE METABOLIC PANEL WITH GFR
ALT: 27 U/L (ref 0–44)
AST: 35 U/L (ref 15–41)
Albumin: 3.3 g/dL — ABNORMAL LOW (ref 3.5–5.0)
Alkaline Phosphatase: 58 U/L (ref 38–126)
Anion gap: 13 (ref 5–15)
BUN: 17 mg/dL (ref 8–23)
CO2: 26 mmol/L (ref 22–32)
Calcium: 9.2 mg/dL (ref 8.9–10.3)
Chloride: 98 mmol/L (ref 98–111)
Creatinine, Ser: 0.54 mg/dL (ref 0.44–1.00)
GFR, Estimated: >60 mL/min (ref >60)
Glucose, Bld: 120 mg/dL — ABNORMAL HIGH (ref 70–99)
Potassium: 3.1 mmol/L — ABNORMAL LOW (ref 3.5–5.1)
Sodium: 137 mmol/L (ref 135–145)
Total Bilirubin: 1 mg/dL (ref 0.0–1.2)
Total Protein: 8.2 g/dL — ABNORMAL HIGH (ref 6.5–8.1)

## 2024-03-15 LAB — LACTIC ACID, PLASMA: Lactic Acid, Venous: 1.4 mmol/L (ref 0.5–1.9)

## 2024-03-15 LAB — PHOSPHORUS: Phosphorus: 2.8 mg/dL (ref 2.5–4.6)

## 2024-03-15 MED ORDER — CLINDAMYCIN PHOSPHATE 600 MG/50ML IV SOLN
600.0000 mg | Freq: Once | INTRAVENOUS | Status: DC
  Filled 2024-03-15: qty 50

## 2024-03-15 MED ORDER — IOHEXOL 300 MG/ML  SOLN
80.0000 mL | Freq: Once | INTRAMUSCULAR | Status: AC | PRN
  Administered 2024-03-15: 80 mL via INTRAVENOUS

## 2024-03-15 MED ORDER — TRAZODONE HCL 50 MG PO TABS
50.0000 mg | ORAL_TABLET | Freq: Every evening | ORAL | Status: DC | PRN
  Administered 2024-03-15 – 2024-03-19 (×5): 50 mg via ORAL
  Filled 2024-03-15 (×5): qty 1

## 2024-03-15 MED ORDER — PANTOPRAZOLE SODIUM 40 MG PO TBEC
40.0000 mg | DELAYED_RELEASE_TABLET | Freq: Every day | ORAL | Status: DC
  Administered 2024-03-17 – 2024-03-20 (×4): 40 mg via ORAL
  Filled 2024-03-15 (×5): qty 1

## 2024-03-15 MED ORDER — BACLOFEN 10 MG PO TABS
10.0000 mg | ORAL_TABLET | Freq: Every day | ORAL | Status: DC
  Administered 2024-03-15 – 2024-03-19 (×5): 10 mg via ORAL
  Filled 2024-03-15 (×5): qty 1

## 2024-03-15 MED ORDER — SODIUM CHLORIDE 0.9 % IV SOLN: INTRAVENOUS | Status: DC

## 2024-03-15 MED ORDER — ONDANSETRON HCL 4 MG/2ML IJ SOLN: 4.0000 mg | Freq: Three times a day (TID) | INTRAMUSCULAR | Status: DC | PRN

## 2024-03-15 MED ORDER — LIDOCAINE-EPINEPHRINE-TETRACAINE (LET) TOPICAL GEL
3.0000 mL | Freq: Once | TOPICAL | Status: AC
  Administered 2024-03-15: 3 mL via TOPICAL
  Filled 2024-03-15 (×2): qty 3

## 2024-03-15 MED ORDER — HYDRALAZINE HCL 20 MG/ML IJ SOLN: 5.0000 mg | INTRAMUSCULAR | Status: DC | PRN

## 2024-03-15 MED ORDER — HYDROCHLOROTHIAZIDE 25 MG PO TABS
25.0000 mg | ORAL_TABLET | Freq: Every day | ORAL | Status: DC
  Filled 2024-03-15: qty 1

## 2024-03-15 MED ORDER — SODIUM CHLORIDE 0.9 % IV SOLN
3.0000 g | Freq: Four times a day (QID) | INTRAVENOUS | Status: DC
  Administered 2024-03-15 – 2024-03-16 (×3): 3 g via INTRAVENOUS
  Filled 2024-03-15 (×5): qty 8

## 2024-03-15 MED ORDER — DIVALPROEX SODIUM 125 MG PO DR TAB
125.0000 mg | DELAYED_RELEASE_TABLET | Freq: Two times a day (BID) | ORAL | Status: DC
  Administered 2024-03-15 – 2024-03-20 (×9): 125 mg via ORAL
  Filled 2024-03-15 (×11): qty 1

## 2024-03-15 MED ORDER — ACETAMINOPHEN 325 MG PO TABS
650.0000 mg | ORAL_TABLET | Freq: Four times a day (QID) | ORAL | Status: DC | PRN
  Administered 2024-03-19 – 2024-03-20 (×2): 650 mg via ORAL
  Filled 2024-03-15 (×2): qty 2

## 2024-03-15 MED ORDER — OXYCODONE-ACETAMINOPHEN 5-325 MG PO TABS: 1.0000 | ORAL_TABLET | ORAL | Status: DC | PRN

## 2024-03-15 NOTE — ED Notes (Signed)
 Attempted to stick pt and only got 2 of 4 tubes called lab.

## 2024-03-15 NOTE — Progress Notes (Signed)
 [facility] Liaison Note  This patient is a current [facility] patient.  [facility] will follow through discharge disposition.  Please call with any hospice related questions or concerns.  [facility] Liaison [phone]

## 2024-03-15 NOTE — H&P (Signed)
 History and Physical    Olivia Martin FMW:[id number] DOB: [birth year] DOA: 03/15/2024  Referring MD/NP/PA:   PCP: Valora Lynwood FALCON, MD   Patient coming from:  The patient is coming from home.     Chief Complaint:   HPI: Olivia Martin is a 83 y.o. female with medical history significant of HTN, HLD, dementia, depression, obesity, currently under hospice care, who presents with neck swelling and pain.  Per her daughter at the bedside, pt started having swelling and pain in the right side of her neck, under right chin area with a knot 1 weak ago.  Pt was seen by hospice.  They switched her ciprofloxacin which is for UTI treatment to Keflex.  Patient feels better in terms of right-sided neck pain, but she then developed swelling and pain in the front neck under her submandible area.  Patient does not have fever or chills. No chest pain, cough, SOB.  No nausea, vomiting, diarrhea or abdominal pain.  Her symptoms of UTI have resolved after taking 4 days of Cipro.  Patient's mental status is at baseline per her daughter.    Data reviewed independently and ED Course: pt was found to have WBC 12.0, potassium 3.1, GFR> 60, lactic acid 1.4, temperature normal, blood pressure 116/67, heart rate 70s, RR 18, oxygen saturation 98% on room air.  Patient is admitted to MedSurg bed as inpatient.  Dr. Edda of ENT is consulted by EDP.  CT of maxillofacial image  1. 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 2. Nodular soft tissue swelling in the submental region, measuring 2.3 x 2.2 x 1.6 cm, with surrounding stranding and mild associated skin thickening, concerning for cellulitis and phlegmon. No focal drainable abscess within the subcutaneous tissues in this region. 3. Visualized upper airway remains patent. 4. Layering secretions in the bilateral sphenoid sinuses. Recommend clinical correlation  for acute sinusitis.    EKG: I have personally reviewed.  Sinus rhythm, QTc 484, poor R wave progression   Review of Systems:   General: no fevers, chills, no body weight gain, fatigue HEENT: no blurry vision, hearing changes or sore throat. Has neck pain and swelling Respiratory: no dyspnea, coughing, wheezing CV: no chest pain, no palpitations GI: no nausea, vomiting, abdominal pain, diarrhea, constipation GU: no dysuria, burning on urination, increased urinary frequency, hematuria  Ext: no leg edema Neuro: no unilateral weakness, numbness, or tingling, no vision change or hearing loss Skin: no rash, no skin tear. MSK: No muscle spasm, no deformity, no limitation of range of movement in spin Heme: No easy bruising.  Travel history: No recent long distant travel.   Allergy:  Allergies  Allergen Reactions   Prednisone Swelling and Other (See Comments)    felt like I couldn't breathe    Past Medical History:  Diagnosis Date   Allergy    Arthritis    Hypertension    Lump or mass in breast 2014    Past Surgical History:  Procedure Laterality Date   ABDOMINAL HYSTERECTOMY  1975   CARPAL TUNNEL RELEASE Bilateral 8004,8000   COLONOSCOPY  2004   [place], [place]    FOOT SURGERY Bilateral 2012,2013   TOTAL KNEE ARTHROPLASTY Right 2000   TOTAL KNEE ARTHROPLASTY Left 1997    Social History:  reports that she has quit smoking. Her smoking use included cigarettes. She has a 20 pack-year smoking history. She has never used smokeless tobacco.  She reports that she does not drink alcohol and does not use drugs.  Family History:  Family History  Problem Relation Age of Onset   Cancer Mother        lung cancer, died at age 67   Cancer Brother 87       lung cancer   Cancer Sister 53       breast cancer   Breast cancer Sister 15     Prior to Admission medications   Medication Sig Start Date End Date Taking? Authorizing Provider  amLODipine (NORVASC) 10 MG tablet Take 5  mg by mouth daily.    [provider]  aspirin  EC 81 MG tablet Take 81 mg by mouth daily.    [provider]  baclofen (LIORESAL) 10 MG tablet Take 10 mg by mouth at bedtime.    [provider]  cyanocobalamin 1000 MCG tablet Take 500 mcg by mouth daily.    [provider]  divalproex  (DEPAKOTE ) 125 MG DR tablet Take 125 mg by mouth 2 (two) times daily.    [provider]  donepezil  (ARICEPT ) 10 MG tablet Take 10 mg by mouth at bedtime.    [provider]  hydrochlorothiazide (HYDRODIURIL) 25 MG tablet Take 25 mg by mouth daily.    [provider]  losartan (COZAAR) 25 MG tablet Take 25 mg by mouth daily.    [provider]  meloxicam (MOBIC) 15 MG tablet Take 15 mg by mouth daily.    [provider]  memantine  (NAMENDA ) 10 MG tablet Take 10 mg by mouth 2 (two) times daily.    [provider]  pantoprazole  (PROTONIX ) 40 MG tablet Take 40 mg by mouth daily.    [provider]  potassium chloride  SA (KLOR-CON ) 20 MEQ tablet Take 20 mEq by mouth daily.    [provider]  rosuvastatin  (CRESTOR ) 20 MG tablet Take 20 mg by mouth daily.    [provider]  sertraline (ZOLOFT) 50 MG tablet Take 25 mg by mouth daily.    [provider]  tiZANidine (ZANAFLEX) 2 MG tablet Take 2 mg by mouth at bedtime as needed for pain.    [provider]    Physical Exam: Vitals:   03/16/24 0000 03/16/24 0030 03/16/24 0100 03/16/24 0150  BP: (!) 104/57 (!) 114/58 (!) 110/57 127/76  Pulse: 78 83 79 76  Resp: (!) 22 20 17 18   Temp:    99.8 F (37.7 C)  TempSrc:      SpO2: 96% 95% 95% 94%  Weight:    88.8 kg  Height:    5' 6 (1.676 m)   General: Not in acute distress HEENT: has tenderness, erythema, induration in submandible area         Eyes: PERRL, EOMI, no jaundice       ENT: No discharge from the ears and nose, no pharynx injection, no tonsillar enlargement.        Neck:  No JVD, no bruit, no mass felt. Heme: No neck lymph node enlargement. Cardiac: S1/S2, RRR, No murmurs, No gallops or rubs. Respiratory: No rales, wheezing, rhonchi or rubs. GI: Soft, nondistended, nontender, no rebound pain, no organomegaly, BS present. GU: No hematuria Ext: No pitting leg edema bilaterally. 1+DP/PT pulse bilaterally. Musculoskeletal: No joint deformities, No joint redness or warmth, no limitation of ROM in spin. Skin: No rashes.  Neuro: Alert, following command, cranial nerves II-XII grossly intact, moves all extremities Psych: Patient is not psychotic, no  suicidal or hemocidal ideation.  Labs on Admission: I have personally reviewed following labs and imaging studies  CBC: Recent Labs  Lab 03/15/24 1349  WBC 12.0*  NEUTROABS 7.9*  HGB 15.0  HCT 45.0  MCV 95.9  PLT 316   Basic Metabolic Panel: Recent Labs  Lab 03/15/24 1313  NA 137  K 3.1*  CL 98  CO2 26  GLUCOSE 120*  BUN 17  CREATININE 0.54  CALCIUM  9.2  MG 2.4  PHOS 2.8   GFR: Estimated Creatinine Clearance: 57.7 mL/min (by C-G formula based on SCr of 0.54 mg/dL). Liver Function Tests: Recent Labs  Lab 03/15/24 1313  AST 35  ALT 27  ALKPHOS 58  BILITOT 1.0  PROT 8.2*  ALBUMIN 3.3*   No results for input(s): LIPASE, AMYLASE in the last 168 hours. No results for input(s): AMMONIA in the last 168 hours. Coagulation Profile: No results for input(s): INR, PROTIME in the last 168 hours. Cardiac Enzymes: No results for input(s): CKTOTAL, CKMB, CKMBINDEX, TROPONINI in the last 168 hours. BNP (last 3 results) No results for input(s): PROBNP in the last 8760 hours. HbA1C: No results for input(s): HGBA1C in the last 72 hours. CBG: No results for input(s): GLUCAP in the last 168 hours. Lipid Profile: No results for input(s): CHOL, HDL, LDLCALC, TRIG, CHOLHDL, LDLDIRECT in the last 72 hours. Thyroid Function Tests: No results for input(s): TSH,  T4TOTAL, FREET4, T3FREE, THYROIDAB in the last 72 hours. Anemia Panel: No results for input(s): VITAMINB12, FOLATE, FERRITIN, TIBC, IRON, RETICCTPCT in the last 72 hours. Urine analysis:    Component Value Date/Time   COLORURINE YELLOW 10/15/2021 1138   APPEARANCEUR CLEAR 10/15/2021 1138   LABSPEC 1.024 10/15/2021 1138   PHURINE 6.0 10/15/2021 1138   GLUCOSEU NEGATIVE 10/15/2021 1138   HGBUR SMALL (A) 10/15/2021 1138   BILIRUBINUR NEGATIVE 10/15/2021 1138   KETONESUR NEGATIVE 10/15/2021 1138   PROTEINUR NEGATIVE 10/15/2021 1138   NITRITE NEGATIVE 10/15/2021 1138   LEUKOCYTESUR NEGATIVE 10/15/2021 1138   Sepsis Labs: @LABRCNTIP (procalcitonin:4,lacticidven:4) )No results found for this or any previous visit (from the past 240 hours).   Radiological Exams on Admission:   Assessment/Plan Principal Problem:   Ludwig angina Active Problems:   Essential hypertension   Hyperlipidemia   Alzheimer's dementia (HCC)   Hypokalemia   Obesity (BMI 30-39.9)   Assessment and Plan:   Ludwig angina with abscess: as shown by CT scan, Ludwig's angina with abscess noted in the anterior floor of the mouth musculature measuring approximately 2.6 x 1.8 x 2.5 cm. There is a nodular soft tissue swelling in the submental region measuring 2.3 x 2.2 x 1.6 cm with surrounding stranding and mild associated skin thickening concerning for cellulitis and phlegmon. Pt has mild leukocytosis with WBC 12.0, but no fever.  Lactic acid normal at 1.4.  Clinically not septic.   EDP consulted Dr. Edda of ENT who recommended aspiration of the abscess with culture. EDP did aspiration, with small amount of purulent material aspirated.  Culture is sent out.  - Admit to MedSurg (patient - Started Unasyn (ED physician ordered clindamycin initially, changed to Unasyn). - Blood culture - Follow-up aspirate culture - As needed Percocet and Tylenol  for pain  Essential hypertension: -IV hydralazine  as needed -hydrochlorothiazide  Hyperlipidemia: Patient is not taking Crestor  currently - Follow-up with PCP  Alzheimer's dementia [facility]): Patient is not taking Namenda  and donepezil  currently -Fall precaution  Hypokalemia: Potassium 3.1.  Magnesium  2.4, phosphorus 2.8. -Repleted potassium  Obesity (BMI 30-39.9): Patient  has Obesity Class I, with body weight 88.8 Kg and BMI 31.6 kg/m2.  - Encourage losing weight - Exercise and healthy diet       DVT ppx: SCD  Code Status: DNR per her daughter  Family Communication:     not done, no family member is at bed side.      Disposition Plan:  Anticipate discharge back to previous environment  Consults called:   Dr. Edda of ENT is consulted by EDP.  Admission status and Level of care: Med-Surg:  as inpt        Dispo: The patient is from: Home              Anticipated d/c is to: Home              Anticipated d/c date is: 2 days              Patient currently is not medically stable to d/c.    Severity of Illness:  The appropriate patient status for this patient is OBSERVATION. Observation status is judged to be reasonable and necessary in order to provide the required intensity of service to ensure the patient's safety. The patient's presenting symptoms, physical exam findings, and initial radiographic and laboratory data in the context of their medical condition is felt to place them at decreased risk for further clinical deterioration. Furthermore, it is anticipated that the patient will be medically stable for discharge from the hospital within 2 midnights of admission.        Date of Service 03/16/2024    Caleb Exon [facility]   If 7PM-7AM, please contact night-coverage [email or web address] 03/16/2024, 2:50 AM

## 2024-03-15 NOTE — ED Provider Notes (Signed)
 [facility] Provider Note   Event Date/Time   First MD Initiated Contact with Patient 03/15/24 1506     (approximate) History  No chief complaint on file.  HPI Olivia Martin is a 83 y.o. female with a past medical history of Alzheimer's disease on hospice who presents via EMS with concerns for a submental abscess.  Patient has also been complaining of lower dental pain however patient's caregiver is at bedside and provides this history.  Patient's caregiver states that she will not open her mouth to allow any sort of dental hygiene and therefore she is concerned that she may have a infected or abscessed tooth.  Caregiver states that patient had swelling to the left aspect of her mouth that occurred approximately 1 week prior to arrival patient has also been on 2 separate antibiotics since that time including clindamycin with improvement of the swelling however now patient is having worsening redness, swelling, and tenderness to palpation in the submental region. ROS: Unable to assess   Physical Exam  Triage Vital Signs: ED Triage Vitals  Encounter Vitals Group     BP 03/15/24 1305 123/61     Girls Systolic BP Percentile --      Girls Diastolic BP Percentile --      Boys Systolic BP Percentile --      Boys Diastolic BP Percentile --      Pulse Rate 03/15/24 1305 77     Resp 03/15/24 1305 16     Temp 03/15/24 1305 97.6 F (36.4 C)     Temp Source 03/15/24 1305 Oral     SpO2 03/15/24 1305 95 %     Weight 03/15/24 1308 212 lb (96.2 kg)     Height 03/15/24 1308 5' 7 (1.702 m)     Head Circumference --      Peak Flow --      Pain Score --      Pain Loc --      Pain Education --      Exclude from Growth Chart --    Most recent vital signs: Vitals:   03/15/24 1800 03/15/24 2300  BP: 116/67 114/63  Pulse: 63 83  Resp: 18 18  Temp:    SpO2: 98% 93%   General: Awake, cooperative CV:  Good peripheral perfusion. Resp:  Normal effort. Abd:  No  distention. Other:  Elderly obese Caucasian female resting comfortably in no acute distress.  2 areas of 2 cm circumference erythema, induration, and tenderness to palpation in the submental region along the mandible ED Results / Procedures / Treatments  Labs (all labs ordered are listed, but only abnormal results are displayed) Labs Reviewed  COMPREHENSIVE METABOLIC PANEL WITH GFR - Abnormal; Notable for the following components:      Result Value   Potassium 3.1 (*)    Glucose, Bld 120 (*)    Total Protein 8.2 (*)    Albumin 3.3 (*)    All other components within normal limits  CBC WITH DIFFERENTIAL/PLATELET - Abnormal; Notable for the following components:   WBC 12.0 (*)    Neutro Abs 7.9 (*)    Monocytes Absolute 1.2 (*)    All other components within normal limits  CULTURE, BLOOD (ROUTINE X 2)  CULTURE, BLOOD (ROUTINE X 2)  LACTIC ACID, PLASMA  MAGNESIUM   PHOSPHORUS  CBC WITH DIFFERENTIAL/PLATELET  PROTIME-INR  APTT  BASIC METABOLIC PANEL WITH GFR  CBC   EKG ED ECG REPORT I, Artist MARLA Kerns,  the attending physician, personally viewed and interpreted this ECG. Date: 03/15/2024 EKG Time: 2048 Rate: 72 Rhythm: normal sinus rhythm QRS Axis: normal Intervals: normal ST/T Wave abnormalities: normal Narrative Interpretation: no evidence of acute ischemia RADIOLOGY ED MD interpretation: CT of the maxillofacial structures with IV contrast concerning for Ludwig's angina with abscess noted in the anterior floor of the mouth musculature measuring approximately 2.6 x 1.8 x 2.5 cm.  There is a nodular soft tissue swelling in the submental region measuring 2.3 x 2.2 x 1.6 cm with surrounding stranding and mild associated skin thickening concerning for cellulitis and phlegmon - All radiology independently interpreted and agree with radiology assessment Official radiology report(s):  PROCEDURES: Critical Care performed: Yes, see critical care procedure note(s) Procedures CRITICAL  CARE Performed by: Darrelle Barrell K Marchello Rothgeb   Total critical care time: 33 minutes  Critical care time was exclusive of separately billable procedures and treating other patients.  Critical care was necessary to treat or prevent imminent or life-threatening deterioration.  Critical care was time spent personally by me on the following activities: development of treatment plan with patient and/or surrogate as well as nursing, discussions with consultants, evaluation of patient's response to treatment, examination of patient, obtaining history from patient or surrogate, ordering and performing treatments and interventions, ordering and review of laboratory studies, ordering and review of radiographic studies, pulse oximetry and re-evaluation of patient's condition.  MEDICATIONS ORDERED IN ED: Medications  0.9 %  sodium chloride  infusion (has no administration in time range)  ondansetron  (ZOFRAN ) injection 4 mg (has no administration in time range)  hydrALAZINE (APRESOLINE) injection 5 mg (has no administration in time range)  acetaminophen  (TYLENOL ) tablet 650 mg (has no administration in time range)  oxyCODONE-acetaminophen  (PERCOCET/ROXICET) 5-325 MG per tablet 1 tablet (has no administration in time range)  Ampicillin-Sulbactam (UNASYN) 3 g in sodium chloride  0.9 % 100 mL IVPB (0 g Intravenous Stopped 03/15/24 2317)  hydrochlorothiazide (HYDRODIURIL) tablet 25 mg (has no administration in time range)  traZODone  (DESYREL ) tablet 50 mg (50 mg Oral Given 03/15/24 2148)  pantoprazole  (PROTONIX ) EC tablet 40 mg (has no administration in time range)  baclofen (LIORESAL) tablet 10 mg (10 mg Oral Given 03/15/24 2148)  divalproex  (DEPAKOTE ) DR tablet 125 mg (125 mg Oral Given 03/15/24 2148)  iohexol (OMNIPAQUE) 300 MG/ML solution 80 mL (80 mLs Intravenous Contrast Given 03/15/24 1642)  lidocaine-EPINEPHrine-tetracaine (LET) topical gel (3 mLs Topical Given 03/15/24 1748)   IMPRESSION / MDM / ASSESSMENT AND  PLAN / ED COURSE  I reviewed the triage vital signs and the nursing notes.                             The patient is on the cardiac monitor to evaluate for evidence of arrhythmia and/or significant heart rate changes. Patient's presentation is most consistent with acute presentation with potential threat to life or bodily function. Patient is a 83 year old female who presents for abscess/inflammation in the submental region after 2 failed courses of antibiotics. DDx: Skin soft tissue infection, odontogenic infection, Ludwig's angina, broken tooth Plan: CBC, CMP, lactate, CT max face with contrast  Results of the CT concerning for Ludwig's angina and failure of outpatient antibiotics with a small fluid collection.  I spoke to on-call ENT who recommends aspiration of this submental abscess with culture.  Patient started on clindamycin and attempt was made to aspirate purulent material.  A small amount of purulent material was aspirated at bedside but  was enough to send for culture. I spoke to the on-call hospitalist who agrees with plan for admission  Dispo: Admit to medicine   FINAL CLINICAL IMPRESSION(S) / ED DIAGNOSES   Final diagnoses:  Ludwig's angina  Submental abscess   Rx / DC Orders   ED Discharge Orders     None      Note:  This document was prepared using Dragon voice recognition software and may include unintentional dictation errors.   Flara Storti K, MD 03/15/24 [phone]

## 2024-03-15 NOTE — ED Triage Notes (Signed)
 PT arrives via [facility] from home with c/o a recent UTI and was put on ABX over a week ago and family noticed swelling on the left side of their face under their jaw, then developed a knot under their chin that showed up last Friday. Pt is being seen by hospice and they were hoping that this knot would have gone away with the ABX, the swelling on the left side of their chin has gone away but the knot is still there. Family read that it could be a dental issue and wanted EMS to take the pt to [facility] but they had to bring the pt here. Pt's family want to find the root cause of the issue and would like for us  to find it medically necessary to be transferred to [facility]. Pt has dementia at baseline and is not A&O.  Pt is being followed by hospice

## 2024-03-16 DIAGNOSIS — Z515 Encounter for palliative care: Secondary | ICD-10-CM | POA: Diagnosis not present

## 2024-03-16 DIAGNOSIS — Z79899 Other long term (current) drug therapy: Secondary | ICD-10-CM | POA: Diagnosis not present

## 2024-03-16 DIAGNOSIS — E876 Hypokalemia: Secondary | ICD-10-CM | POA: Diagnosis present

## 2024-03-16 DIAGNOSIS — M1990 Unspecified osteoarthritis, unspecified site: Secondary | ICD-10-CM | POA: Diagnosis present

## 2024-03-16 DIAGNOSIS — Z6831 Body mass index (BMI) 31.0-31.9, adult: Secondary | ICD-10-CM | POA: Diagnosis not present

## 2024-03-16 DIAGNOSIS — G309 Alzheimer's disease, unspecified: Secondary | ICD-10-CM | POA: Diagnosis present

## 2024-03-16 DIAGNOSIS — E66811 Obesity, class 1: Secondary | ICD-10-CM | POA: Diagnosis present

## 2024-03-16 DIAGNOSIS — G43909 Migraine, unspecified, not intractable, without status migrainosus: Secondary | ICD-10-CM | POA: Insufficient documentation

## 2024-03-16 DIAGNOSIS — Z66 Do not resuscitate: Secondary | ICD-10-CM | POA: Diagnosis present

## 2024-03-16 DIAGNOSIS — Z791 Long term (current) use of non-steroidal anti-inflammatories (NSAID): Secondary | ICD-10-CM | POA: Diagnosis not present

## 2024-03-16 DIAGNOSIS — T8089XA Other complications following infusion, transfusion and therapeutic injection, initial encounter: Secondary | ICD-10-CM | POA: Diagnosis not present

## 2024-03-16 DIAGNOSIS — K122 Cellulitis and abscess of mouth: Secondary | ICD-10-CM

## 2024-03-16 DIAGNOSIS — Y848 Other medical procedures as the cause of abnormal reaction of the patient, or of later complication, without mention of misadventure at the time of the procedure: Secondary | ICD-10-CM | POA: Diagnosis not present

## 2024-03-16 DIAGNOSIS — Z87440 Personal history of urinary (tract) infections: Secondary | ICD-10-CM | POA: Diagnosis not present

## 2024-03-16 DIAGNOSIS — L8990 Pressure ulcer of unspecified site, unspecified stage: Secondary | ICD-10-CM | POA: Insufficient documentation

## 2024-03-16 DIAGNOSIS — L89152 Pressure ulcer of sacral region, stage 2: Secondary | ICD-10-CM | POA: Diagnosis present

## 2024-03-16 DIAGNOSIS — Z7982 Long term (current) use of aspirin: Secondary | ICD-10-CM | POA: Diagnosis not present

## 2024-03-16 DIAGNOSIS — E785 Hyperlipidemia, unspecified: Secondary | ICD-10-CM | POA: Diagnosis present

## 2024-03-16 DIAGNOSIS — Z90710 Acquired absence of both cervix and uterus: Secondary | ICD-10-CM | POA: Diagnosis not present

## 2024-03-16 DIAGNOSIS — R221 Localized swelling, mass and lump, neck: Secondary | ICD-10-CM | POA: Diagnosis present

## 2024-03-16 DIAGNOSIS — I10 Essential (primary) hypertension: Secondary | ICD-10-CM | POA: Diagnosis present

## 2024-03-16 DIAGNOSIS — F32A Depression, unspecified: Secondary | ICD-10-CM | POA: Diagnosis present

## 2024-03-16 DIAGNOSIS — Z87891 Personal history of nicotine dependence: Secondary | ICD-10-CM | POA: Diagnosis not present

## 2024-03-16 DIAGNOSIS — F02C0 Dementia in other diseases classified elsewhere, severe, without behavioral disturbance, psychotic disturbance, mood disturbance, and anxiety: Secondary | ICD-10-CM | POA: Diagnosis present

## 2024-03-16 DIAGNOSIS — Z7401 Bed confinement status: Secondary | ICD-10-CM | POA: Diagnosis not present

## 2024-03-16 DIAGNOSIS — Z96653 Presence of artificial knee joint, bilateral: Secondary | ICD-10-CM | POA: Diagnosis present

## 2024-03-16 LAB — APTT: aPTT: 35 s (ref 24–36)

## 2024-03-16 LAB — BASIC METABOLIC PANEL WITH GFR
Anion gap: 13 (ref 5–15)
BUN: 15 mg/dL (ref 8–23)
CO2: 27 mmol/L (ref 22–32)
Calcium: 9.1 mg/dL (ref 8.9–10.3)
Chloride: 99 mmol/L (ref 98–111)
Creatinine, Ser: 0.62 mg/dL (ref 0.44–1.00)
GFR, Estimated: >60 mL/min (ref >60)
Glucose, Bld: 143 mg/dL — ABNORMAL HIGH (ref 70–99)
Potassium: 3.1 mmol/L — ABNORMAL LOW (ref 3.5–5.1)
Sodium: 139 mmol/L (ref 135–145)

## 2024-03-16 LAB — CBC
HCT: 44.2 % (ref 36.0–46.0)
Hemoglobin: 14.8 g/dL (ref 12.0–15.0)
MCH: 31.8 pg (ref 26.0–34.0)
MCHC: 33.5 g/dL (ref 30.0–36.0)
MCV: 95.1 fL (ref 80.0–100.0)
Platelets: 316 K/uL (ref 150–400)
RBC: 4.65 MIL/uL (ref 3.87–5.11)
RDW: 12.6 % (ref 11.5–15.5)
WBC: 11.3 K/uL — ABNORMAL HIGH (ref 4.0–10.5)
nRBC: 0 % (ref 0.0–0.2)

## 2024-03-16 LAB — PROTIME-INR
INR: 1 (ref 0.8–1.2)
Prothrombin Time: 13.7 s (ref 11.4–15.2)

## 2024-03-16 LAB — MAGNESIUM: Magnesium: 2.2 mg/dL (ref 1.7–2.4)

## 2024-03-16 LAB — GLUCOSE, CAPILLARY: Glucose-Capillary: 120 mg/dL — ABNORMAL HIGH (ref 70–99)

## 2024-03-16 MED ORDER — VANCOMYCIN HCL 2000 MG/400ML IV SOLN
2000.0000 mg | Freq: Once | INTRAVENOUS | Status: AC
  Administered 2024-03-16: 2000 mg via INTRAVENOUS
  Filled 2024-03-16: qty 400

## 2024-03-16 MED ORDER — DIPHENHYDRAMINE HCL 25 MG PO CAPS
50.0000 mg | ORAL_CAPSULE | Freq: Four times a day (QID) | ORAL | Status: DC | PRN
  Administered 2024-03-16: 50 mg via ORAL
  Filled 2024-03-16: qty 2

## 2024-03-16 MED ORDER — KCL IN DEXTROSE-NACL 20-5-0.45 MEQ/L-%-% IV SOLN
INTRAVENOUS | Status: DC
  Filled 2024-03-16 (×2): qty 1000

## 2024-03-16 MED ORDER — SODIUM CHLORIDE 0.9 % IV SOLN
3.0000 g | Freq: Four times a day (QID) | INTRAVENOUS | Status: DC
  Administered 2024-03-16 – 2024-03-20 (×17): 3 g via INTRAVENOUS
  Filled 2024-03-16 (×19): qty 8

## 2024-03-16 MED ORDER — POTASSIUM CHLORIDE CRYS ER 20 MEQ PO TBCR
40.0000 meq | EXTENDED_RELEASE_TABLET | Freq: Once | ORAL | Status: AC
  Administered 2024-03-16: 40 meq via ORAL
  Filled 2024-03-16: qty 2

## 2024-03-16 MED ORDER — VANCOMYCIN HCL 1250 MG/250ML IV SOLN
1250.0000 mg | INTRAVENOUS | Status: DC
  Administered 2024-03-17: 1250 mg via INTRAVENOUS
  Filled 2024-03-16 (×2): qty 250

## 2024-03-16 NOTE — Progress Notes (Addendum)
 PROGRESS NOTE    Olivia Martin  FMW:[id number] DOB: [birth year] DOA: 03/15/2024 PCP: Valora Lynwood FALCON, MD     Brief Narrative:   From admission h and p  Olivia Martin is a 83 y.o. female with medical history significant of HTN, HLD, dementia, depression, obesity, currently under hospice care, who presents with neck swelling and pain.   Per her daughter at the bedside, pt started having swelling and pain in the right side of her neck, under right chin area with a knot 1 weak ago.  Pt was seen by hospice.  They switched her ciprofloxacin which is for UTI treatment to Keflex.  Patient feels better in terms of right-sided neck pain, but she then developed swelling and pain in the front neck under her submandible area.  Patient does not have fever or chills. No chest pain, cough, SOB.  No nausea, vomiting, diarrhea or abdominal pain.  Her symptoms of UTI have resolved after taking 4 days of Cipro.  Patient's mental status is at baseline per her daughter.    Assessment & Plan:   Principal Problem:   Ludwig angina Active Problems:   Essential hypertension   Hyperlipidemia   Alzheimer's dementia (HCC)   Hypokalemia   Obesity (BMI 30-39.9)   Pressure injury of skin  # Ludwig angina Abscess anterior floor of mouth musculature 2x2 cm, also submental soft tissue swelling. EDP unable to drain fluid for culture. No respiratory compromise at the moment. Failed outpt keflex - continue unasyn, add vanc  - ENT (Dr. Juengel) to see today - maintain npo  # Vanc hypersensitivity reaction Today, resolved with slowing infusion  # Advanced dementia Under hospice care, goals remain treat the treatable, daughter affirms does want to treat this infection, would consent to surgery if that's advised. Bedbound at baseline, very limited communication. - home depakote  bid, trazodone  when tolerating po  # Hypokalemia Home hydrochlorothiazide likely contributory - replete, f/u mg  # HTN Bp  controlled - home hydrochlorothiazide on hold   DVT prophylaxis: SCDs Code Status: dnr/dni Family Communication: daughter Olivia Martin at bedside  Level of care: Med-Surg Status is: Observation     Consultants:  ent  Procedures: None thus far  Antimicrobials:  See above    Subjective: No complaints, demented  Objective: Vitals:   03/16/24 0100 03/16/24 0150 03/16/24 0623 03/16/24 0736  BP: (!) 110/57 127/76 (!) 130/52 135/63  Pulse: 79 76 69 72  Resp: 17 18 18 17   Temp:  99.8 F (37.7 C) 99.1 F (37.3 C) 99.3 F (37.4 C)  TempSrc:   Axillary Axillary  SpO2: 95% 94% 93% 91%  Weight:  88.8 kg    Height:  5' 6 (1.676 m)      Intake/Output Summary (Last 24 hours) at 03/16/2024 1103 Last data filed at 03/16/2024 0651 Gross per 24 hour  Intake 410.38 ml  Output --  Net 410.38 ml   Filed Weights   03/15/24 1308 03/16/24 0150  Weight: 96.2 kg 88.8 kg    Examination:  General exam: Appears calm and comfortable, chronically ill Respiratory system: Clear to auscultation. Respiratory effort normal. Cardiovascular system: S1 & S2 heard, RRR.   Gastrointestinal system: Abdomen is nondistended, soft and nontender.  Central nervous system: Alert, not oriented, moving all 4 Extremities: Symmetric 5 x 5 power. Skin: erythema and swelling submandibular space, doesn't consent for exam of mouth Psychiatry: demented, calm    Data Reviewed: I have personally reviewed following labs and imaging studies  CBC: Recent  Labs  Lab 03/15/24 1349 03/16/24 0510  WBC 12.0* 11.3*  NEUTROABS 7.9*  --   HGB 15.0 14.8  HCT 45.0 44.2  MCV 95.9 95.1  PLT 316 316   Basic Metabolic Panel: Recent Labs  Lab 03/15/24 1313 03/16/24 0510  NA 137 139  K 3.1* 3.1*  CL 98 99  CO2 26 27  GLUCOSE 120* 143*  BUN 17 15  CREATININE 0.54 0.62  CALCIUM  9.2 9.1  MG 2.4  --   PHOS 2.8  --    GFR: Estimated Creatinine Clearance: 57.7 mL/min (by C-G formula based on SCr of 0.62  mg/dL). Liver Function Tests: Recent Labs  Lab 03/15/24 1313  AST 35  ALT 27  ALKPHOS 58  BILITOT 1.0  PROT 8.2*  ALBUMIN 3.3*   No results for input(s): LIPASE, AMYLASE in the last 168 hours. No results for input(s): AMMONIA in the last 168 hours. Coagulation Profile: Recent Labs  Lab 03/16/24 0510  INR 1.0   Cardiac Enzymes: No results for input(s): CKTOTAL, CKMB, CKMBINDEX, TROPONINI in the last 168 hours. BNP (last 3 results) No results for input(s): PROBNP in the last 8760 hours. HbA1C: No results for input(s): HGBA1C in the last 72 hours. CBG: No results for input(s): GLUCAP in the last 168 hours. Lipid Profile: No results for input(s): CHOL, HDL, LDLCALC, TRIG, CHOLHDL, LDLDIRECT in the last 72 hours. Thyroid Function Tests: No results for input(s): TSH, T4TOTAL, FREET4, T3FREE, THYROIDAB in the last 72 hours. Anemia Panel: No results for input(s): VITAMINB12, FOLATE, FERRITIN, TIBC, IRON, RETICCTPCT in the last 72 hours. Urine analysis:    Component Value Date/Time   COLORURINE YELLOW 10/15/2021 1138   APPEARANCEUR CLEAR 10/15/2021 1138   LABSPEC 1.024 10/15/2021 1138   PHURINE 6.0 10/15/2021 1138   GLUCOSEU NEGATIVE 10/15/2021 1138   HGBUR SMALL (A) 10/15/2021 1138   BILIRUBINUR NEGATIVE 10/15/2021 1138   KETONESUR NEGATIVE 10/15/2021 1138   PROTEINUR NEGATIVE 10/15/2021 1138   NITRITE NEGATIVE 10/15/2021 1138   LEUKOCYTESUR NEGATIVE 10/15/2021 1138   Sepsis Labs: @LABRCNTIP (procalcitonin:4,lacticidven:4)  ) Recent Results (from the past 240 hours)  Culture, blood (Routine X 2) w Reflex to ID Panel     Status: None (Preliminary result)   Collection Time: 03/15/24  8:00 PM   Specimen: BLOOD  Result Value Ref Range Status   Specimen Description BLOOD BLOOD RIGHT FOREARM  Final   Special Requests   Final    Blood Culture adequate volume BOTTLES DRAWN AEROBIC AND ANAEROBIC   Culture   Final    NO  GROWTH < 12 HOURS Performed at [facility], [address] [place], [place] [address]    Report Status PENDING  Incomplete  Culture, blood (Routine X 2) w Reflex to ID Panel     Status: None (Preliminary result)   Collection Time: 03/15/24  8:15 PM   Specimen: BLOOD  Result Value Ref Range Status   Specimen Description BLOOD RIGHT ANTECUBITAL  Final   Special Requests   Final    BOTTLES DRAWN AEROBIC AND ANAEROBIC Blood Culture adequate volume   Culture   Final    NO GROWTH < 12 HOURS Performed at [facility], [address] [place], [place] [address]    Report Status PENDING  Incomplete                Scheduled Meds:  baclofen  10 mg Oral QHS   divalproex   125 mg Oral BID   hydrochlorothiazide  25 mg  Oral Daily   pantoprazole   40 mg Oral Daily   Continuous Infusions:  sodium chloride  75 mL/hr at 03/16/24 0005   ampicillin-sulbactam (UNASYN) IV 3 g (03/16/24 1044)     LOS: 1 day     Devaughn KATHEE Ban, MD [facility]   If 7PM-7AM, please contact night-coverage [email or web address] Password [email or web address] 03/16/2024, 11:03 AM

## 2024-03-16 NOTE — Progress Notes (Signed)
 [facility] rm 150 [facility] hospital liaison note:  Olivia Martin is a current [facility] patient with a terminal diagnosis of Alzheimer's Disease. Patient has a knot under her chin that has been getting worse despite treatment with oral antibiotics. Family made decision to transport her to the ED for further evaluation. She was admitted on 10.22 with a diagnosis of Ludwig angina (an abscess in her submandibular region). Per Dr. Norleen Laurence with [facility] this is a related hospital admission.  Visited at bedside with patient who is resting in bed with eyes closed and daughter at bedside. Daughter reports that Dr. Juengal rounded on patient and at this time they think she can be treated with IVAB and not need surgical intervention. She has no apparent signs of distress.   Patient is GIP appropriate with need for IVAB.   Vital Signs- 97/77/16   113/97   spO2 96% on room air  I&O-  610/425 Abnormal Labs- K+ 3.1, WBC 12 Diagnostics-   CT Maxofacial IMPRESSION: 1. 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 2. Nodular soft tissue swelling in the submental region, measuring 2.3 x 2.2 x 1.6 cm, with surrounding stranding and mild associated skin thickening, concerning for cellulitis and phlegmon. No focal drainable abscess within the subcutaneous tissues in this region. 3. Visualized upper airway remains patent. 4. Layering secretions in the bilateral sphenoid sinuses. Recommend clinical correlation for acute sinusitis.  IV/PRN Meds- NS @ 75ml/H IV, Unasyn 3g q6H IV, Cleocin 600mg  IV once, D5 1/2NS @ 100ml/H, Vancomycin  2000mg  IV once, Benadryl 50mg  PO once, Trazodone  50mg  PO once  Current plan as per PN: Dr. Devaughn Ban 10.23.25   Ludwig angina Abscess anterior floor of mouth musculature 2x2 cm, also submental soft tissue swelling. EDP unable to drain fluid for culture. No  respiratory compromise at the moment. Failed outpt keflex - continue unasyn, add vanc  - ENT (Dr. Juengel) to see today - maintain npo   # Vanc hypersensitivity reaction Today, resolved with slowing infusion   # Advanced dementia Under hospice care, goals remain treat the treatable, daughter affirms does want to treat this infection, would consent to surgery if that's advised. Bedbound at baseline, very limited communication. - home depakote  bid, trazodone  when tolerating po   # Hypokalemia Home hydrochlorothiazide likely contributory - replete, f/u mg   # HTN Bp controlled - home hydrochlorothiazide on hold  Discharge Planning- Ongoing, Pending medical stabilization and completion of IVAB Family Contact- talked with daughter Olivia Martin at bedside IDT: Updated Goals of Care: DNR Transfer summary and Med list sent to be placed under Media tab. Should patient require EMS transport at discharge please reach out to [facility] as we contract with them for this service. Thank you for the opportunity to participate in this patient's care, please don't hesitate to call for any hospice related questions or concerns.  Hunter Seip, BSN, [facility] Liaison [phone]

## 2024-03-16 NOTE — Progress Notes (Signed)
 Pt presenting w/ red and flushed face, upper chest, and back of neck following vancomycin  administration. VS WNL. MD and pharmacy notified. Benadryl given and rate of infusion lowered.

## 2024-03-16 NOTE — Consult Note (Signed)
 Olivia Martin, [place] [id number] Jan 11, 1941 Olivia Devaughn Sayres, MD  Reason for Consult: Submental abscess  HPI: The patient is an 83 year old white female who has a history of severe dementia along with hypertension, hyperlipidemia, depression, obesity, who is currently under hospice care.  She started having some tenderness in her neck.  She had been on Cipro for UTI and was switched to Keflex  for possible infection.  Her pain worsened and she presented to the emergency room yesterday.  She also had some tenderness in her right submandibular gland but had not been eating or drinking much recently.  She did not have any fever or chills.  No cough or shortness of breath and no nasal symptoms.  Her mental status was at baseline.  A CT scan was done which showed an anterior abscess adjacent to the mandible in the anterior submental area.  Aspiration was attempted in the emergency department and some pus was removed and sent for culture.  She was switched to Augmentin and has been on that for several doses now.  Assessment is made of her mouth and throat to see if surgical drainage is indicated.  Allergies:  Allergies  Allergen Reactions   Prednisone Swelling and Other (See Comments)    felt like I couldn't breathe    ROS: Review of systems normal other than 12 systems except per HPI.  PMH:  Past Medical History:  Diagnosis Date   Allergy    Arthritis    Hypertension    Lump or mass in breast 2014    FH:  Family History  Problem Relation Age of Onset   Cancer Mother        lung cancer, died at age 14   Cancer Brother 47       lung cancer   Cancer Sister 36       breast cancer   Breast cancer Sister 30    SH:  Social History   Socioeconomic History   Marital status: Single    Spouse name: Not on file   Number of children: Not on file   Years of education: Not on file   Highest education level: Not on file  Occupational History   Not on file  Tobacco Use   Smoking status:  Former    Current packs/day: 1.00    Average packs/day: 1 pack/day for 20.0 years (20.0 ttl pk-yrs)    Types: Cigarettes   Smokeless tobacco: Never  Substance and Sexual Activity   Alcohol use: No   Drug use: No   Sexual activity: Not on file  Other Topics Concern   Not on file  Social History Narrative   Not on file   Social Drivers of Health   Financial Resource Strain: Not on file  Food Insecurity: No Food Insecurity (03/16/2024)   Hunger Vital Sign    Worried About Running Out of Food in the Last Year: Never true    Ran Out of Food in the Last Year: Never true  Transportation Needs: No Transportation Needs (03/16/2024)   PRAPARE - Administrator, Civil Service (Medical): No    Lack of Transportation (Non-Medical): No  Physical Activity: Not on file  Stress: Not on file  Social Connections: Patient Unable To Answer (03/16/2024)   Social Connection and Isolation Panel    Frequency of Communication with Friends and Family: Patient unable to answer    Frequency of Social Gatherings with Friends and Family: Patient unable to answer    Attends Religious  Services: Patient unable to answer    Active Member of Clubs or Organizations: Patient unable to answer    Attends Club or Organization Meetings: Patient unable to answer    Marital Status: Patient unable to answer  Intimate Partner Violence: Patient Unable To Answer (03/16/2024)   Humiliation, Afraid, Rape, and Kick questionnaire    Fear of Current or Ex-Partner: Patient unable to answer    Emotionally Abused: Patient unable to answer    Physically Abused: Patient unable to answer    Sexually Abused: Patient unable to answer    PSH:  Past Surgical History:  Procedure Laterality Date   ABDOMINAL HYSTERECTOMY  1975   CARPAL TUNNEL RELEASE Bilateral [id number]   COLONOSCOPY  2004   [place], [place]    FOOT SURGERY Bilateral 2012,2013   TOTAL KNEE ARTHROPLASTY Right 2000   TOTAL KNEE ARTHROPLASTY Left 1997     Physical  Exam: The patient opens her eyes and speaks to me however she does not follow any commands.  She is severely demented and does not understand what I am saying to her.  She has some slight swelling in the midline beneath her anterior mandible and in the submental area.  There is 1 small area that is soft that suggest there might be some purulent mucus in here but it is quite small.  The redness that has been in her neck has gotten less and she is now not so tender that I can touch the area and she is not complaining of severe pain.  I can get her to open her mouth a little bit by mimicking me and I can see the anterior floor of mouth.  There is no cellulitis or swelling there and she has great mobility of her tongue.  There is no swelling of the tongue either.  The rest of her neck is not swollen at all and she does not have any swelling in her submandibular gland at this time although her daughter describes that was the area that was a little swollen when she came in.  She is more hydrated now than she was when she came in.   A/P: The patient is under hospice care and has severe dementia.  She has a small localized infection right along the anterior mandible inside and in the submental area.  This most likely started from an anterior tooth although she does not open her mouth and allow any tooth care and bites down on toothbrushes anytime her daughter tries to work on this area.  The localized infection seems to be settling down although there could be a small little area that points to the outside that could be drained in the future.  At this point she is markedly improved and is likely that antibiotics alone may help solve this problem for now.  She may need to have a tooth removed to solve this long-term.  I am reluctant to do any surgery on her because of her hospice situation, and would prefer not to leave an open wound anywhere.  I would recommend we continue the IV Augmentin as it seems to  be working well.  We will continue this for the next several days to see how she does.  I will be out of town for the next several days but will let Dr. Carolee but know about the situation so that he can reevaluate if needed.  She is okay to start soft foods like trying to be filled for that day and liquids as  she has been on.  She does not chew well and has not been on a regular diet for a long time.  I am not planning on any surgical intervention so she does not need to be n.p.o.   [place]ard DEL Jackquelyn Sundberg 03/16/2024 1:40 PM

## 2024-03-16 NOTE — Plan of Care (Signed)
  Problem: Health Behavior/Discharge Planning: Goal: Ability to manage health-related needs will improve Outcome: Progressing   Problem: Clinical Measurements: Goal: Ability to maintain clinical measurements within normal limits will improve Outcome: Progressing Goal: Will remain Rodell Marrs from infection Outcome: Progressing Goal: Diagnostic test results will improve Outcome: Progressing Goal: Respiratory complications will improve Outcome: Progressing Goal: Cardiovascular complication will be avoided Outcome: Progressing   Problem: Activity: Goal: Risk for activity intolerance will decrease Outcome: Progressing   Problem: Nutrition: Goal: Adequate nutrition will be maintained Outcome: Progressing   Problem: Coping: Goal: Level of anxiety will decrease Outcome: Progressing   Problem: Elimination: Goal: Will not experience complications related to bowel motility Outcome: Progressing Goal: Will not experience complications related to urinary retention Outcome: Progressing   Problem: Pain Managment: Goal: General experience of comfort will improve and/or be controlled Outcome: Progressing   Problem: Safety: Goal: Ability to remain Tiwanda Threats from injury will improve Outcome: Progressing   Problem: Skin Integrity: Goal: Risk for impaired skin integrity will decrease Outcome: Progressing   Problem: Education: Goal: Knowledge of General Education information will improve Description: Including pain rating scale, medication(s)/side effects and non-pharmacologic comfort measures Outcome: Not Progressing Note: Pt disoriented x4 at baseline. Family receptive to education.

## 2024-03-16 NOTE — Progress Notes (Signed)
 Pharmacy Antibiotic Note  Olivia Martin is a 83 y.o. female admitted on 03/15/2024 with ludwig angina with abscess.  Patient presented to ED with neck and swelling and pain as well as a knot under her right chin area x 1 week. Patient was on ciprofloxacin for UTI which was then switched to cephalexin. After this transition, patient reports improvement in right-sided neck pain but then developed swelling and pain in the front of her neck, under her sub-mandible area. CT confirmed Ludwig angina with abscess. Pharmacy has been consulted for vancomycin  dosing.  Plan: Give vancomycin  2000 mg IV x1 followed by 1250 mg IV Q24H. Goal AUC 400-550. Expected AUC: 440.9 Expected Css min: 11 SCr used: 0.8  Weight used: IBW, Vd used: 0.72 (BMI 31.6) Patient is also on Unasyn 3 g IV Q6H Continue to monitor renal function and follow culture results   Height: 5' 6 (167.6 cm) Weight: 88.8 kg (195 lb 12.3 oz) IBW/kg (Calculated) : 59.3  Temp (24hrs), Avg:99 F (37.2 C), Min:97.6 F (36.4 C), Max:99.8 F (37.7 C)  Recent Labs  Lab 03/15/24 1313 03/15/24 1349 03/15/24 1350 03/16/24 0510  WBC  --  12.0*  --  11.3*  CREATININE 0.54  --   --  0.62  LATICACIDVEN  --   --  1.4  --     Estimated Creatinine Clearance: 57.7 mL/min (by C-G formula based on SCr of 0.62 mg/dL).    Allergies  Allergen Reactions   Prednisone Swelling and Other (See Comments)    felt like I couldn't breathe    Antimicrobials this admission: 10/22 Unasyn >>  10/23 Vanc >>   Microbiology results: 10/22 BCx: in process   Thank you for allowing pharmacy to be a part of this patient's care.  Lum VEAR Mania, PharmD, BCPS 03/16/2024 11:00 AM

## 2024-03-17 DIAGNOSIS — K122 Cellulitis and abscess of mouth: Secondary | ICD-10-CM | POA: Diagnosis not present

## 2024-03-17 LAB — GLUCOSE, CAPILLARY: Glucose-Capillary: 117 mg/dL — ABNORMAL HIGH (ref 70–99)

## 2024-03-17 LAB — CBC
HCT: 40.5 % (ref 36.0–46.0)
Hemoglobin: 13.3 g/dL (ref 12.0–15.0)
MCH: 31.5 pg (ref 26.0–34.0)
MCHC: 32.8 g/dL (ref 30.0–36.0)
MCV: 96 fL (ref 80.0–100.0)
Platelets: 278 K/uL (ref 150–400)
RBC: 4.22 MIL/uL (ref 3.87–5.11)
RDW: 12.7 % (ref 11.5–15.5)
WBC: 11.8 K/uL — ABNORMAL HIGH (ref 4.0–10.5)
nRBC: 0 % (ref 0.0–0.2)

## 2024-03-17 LAB — BASIC METABOLIC PANEL WITH GFR
Anion gap: 10 (ref 5–15)
BUN: 9 mg/dL (ref 8–23)
CO2: 25 mmol/L (ref 22–32)
Calcium: 8.4 mg/dL — ABNORMAL LOW (ref 8.9–10.3)
Chloride: 105 mmol/L (ref 98–111)
Creatinine, Ser: 0.54 mg/dL (ref 0.44–1.00)
GFR, Estimated: >60 mL/min (ref >60)
Glucose, Bld: 115 mg/dL — ABNORMAL HIGH (ref 70–99)
Potassium: 3.2 mmol/L — ABNORMAL LOW (ref 3.5–5.1)
Sodium: 140 mmol/L (ref 135–145)

## 2024-03-17 LAB — MRSA NEXT GEN BY PCR, NASAL: MRSA by PCR Next Gen: NOT DETECTED

## 2024-03-17 MED ORDER — POTASSIUM CHLORIDE CRYS ER 20 MEQ PO TBCR
60.0000 meq | EXTENDED_RELEASE_TABLET | Freq: Once | ORAL | Status: AC
  Administered 2024-03-17: 60 meq via ORAL
  Filled 2024-03-17: qty 3

## 2024-03-17 NOTE — Progress Notes (Signed)
 [facility] rm 150 [facility] hospital liaison note:   Olivia Martin is a current [facility] patient with a terminal diagnosis of Alzheimer's Disease. Patient has a knot under her chin that has been getting worse despite treatment with oral antibiotics. Family made decision to transport her to the ED for further evaluation. She was admitted on 10.22 with a diagnosis of Ludwig angina (an abscess in her submandibular region). Per Dr. Norleen Laurence with [facility] this is a related hospital admission.   Visited at bedside with daughter present. Patient was awake, but only smiled when spoken to.  Daughter waiting for MD to arrive to ask him some questions.     Patient is GIP appropriate with need for IVAB.    Vital Signs- 98.2, 136/59,  79, 15  spO2 94% on room air   I&O- 400/725  Abnormal Labs-potassium 3.2,  glucose 115,  WBC11.8,  Calcium  8.4  Diagnostics-  No new diagnostics  IV/PRN Meds- NS @ 75ml/H IV, Unasyn 3g q6H IV, Cleocin 600mg  IV once, D5 1/2NS @ 156ml/H, Vancomycin  1250 q 24hr IV start 10.24.25 at 12:30pm.    Current plan as per PN: Dr. Devaughn Ban 10.24.25  Assessment & Plan:   Principal Problem:   Ludwig angina Active Problems:   Essential hypertension   Hyperlipidemia   Alzheimer's dementia (HCC)   Hypokalemia   Obesity (BMI 30-39.9)   Pressure injury of skin   # Ludwig angina Abscess anterior floor of mouth musculature 2x2 cm, also submental soft tissue swelling. EDP unable to drain fluid for culture. No respiratory compromise at the moment. Failed outpt keflex. ENT advises abx, hopes to avoid surgery. Does appear to be improving - continue unasyn, added vanc, will f/u mrsa swab, consider d/c vanc if neg   # Vanc hypersensitivity reaction Resolved with slowing infusion   # Advanced dementia Under hospice care, goals remain treat the treatable, daughter affirms does want to treat this infection, would consent to surgery if that's advised. Bedbound  at baseline, very limited communication. - home depakote  bid, trazodone  when tolerating po   # Hypokalemia Home hydrochlorothiazide likely contributory - replete    # HTN Bp controlled - home hydrochlorothiazide on hold  Discharge Planning- Ongoing, Pending medical stabilization and completion of IVAB Family Contact- Spoke with daughter at bedside.   IDT: Updated Goals of Care: DNR Transfer summary and Med list sent to be placed under Media tab. Should patient require EMS transport at discharge please reach out to [facility] as we contract with them for this service. Thank you for the opportunity to participate in this patient's care, please don't hesitate to call for any hospice related questions or concerns.  [facility] Liaison [phone]

## 2024-03-17 NOTE — Progress Notes (Signed)
 PROGRESS NOTE    [name]  FMW:[id number] DOB: 02-27-1941 DOA: 03/15/2024 PCP: Valora Lynwood FALCON, MD     Brief Narrative:   From admission h and p  Olivia Martin is a 83 y.o. female with medical history significant of HTN, HLD, dementia, depression, obesity, currently under hospice care, who presents with neck swelling and pain.   Per her daughter at the bedside, pt started having swelling and pain in the right side of her neck, under right chin area with a knot 1 weak ago.  Pt was seen by hospice.  They switched her ciprofloxacin which is for UTI treatment to Keflex.  Patient feels better in terms of right-sided neck pain, but she then developed swelling and pain in the front neck under her submandible area.  Patient does not have fever or chills. No chest pain, cough, SOB.  No nausea, vomiting, diarrhea or abdominal pain.  Her symptoms of UTI have resolved after taking 4 days of Cipro.  Patient's mental status is at baseline per her daughter.    Assessment & Plan:   Principal Problem:   Ludwig angina Active Problems:   Essential hypertension   Hyperlipidemia   Alzheimer's dementia (HCC)   Hypokalemia   Obesity (BMI 30-39.9)   Pressure injury of skin  # Ludwig angina Abscess anterior floor of mouth musculature 2x2 cm, also submental soft tissue swelling. EDP unable to drain fluid for culture. No respiratory compromise at the moment. Failed outpt keflex. ENT advises abx, hopes to avoid surgery. Does appear to be improving - continue unasyn, added vanc, will f/u mrsa swab, consider d/c vanc if neg  # Vanc hypersensitivity reaction Resolved with slowing infusion  # Advanced dementia Under hospice care, goals remain treat the treatable, daughter affirms does want to treat this infection, would consent to surgery if that's advised. Bedbound at baseline, very limited communication. - home depakote  bid, trazodone  when tolerating po  # Hypokalemia Home  hydrochlorothiazide likely contributory - replete   # HTN Bp controlled - home hydrochlorothiazide on hold   DVT prophylaxis: SCDs Code Status: dnr/dni Family Communication: daughter heron at bedside 10/24  Level of care: Med-Surg Status is: inpg     Consultants:  ent  Procedures: None thus far  Antimicrobials:  See above    Subjective: No complaints, demented  Objective: Vitals:   03/16/24 2008 03/17/24 0440 03/17/24 0728 03/17/24 1627  BP: 124/87 (!) 130/58 (!) 136/59 (!) 104/58  Pulse: 75 77 79 64  Resp: 17 19 15 19   Temp: 98.2 F (36.8 C) 98.9 F (37.2 C) 98.2 F (36.8 C) 98.6 F (37 C)  TempSrc: Oral   Oral  SpO2: 93% 92% 94% 97%  Weight:      Height:        Intake/Output Summary (Last 24 hours) at 03/17/2024 1649 Last data filed at 03/17/2024 1307 Gross per 24 hour  Intake 730 ml  Output 300 ml  Net 430 ml   Filed Weights   03/15/24 1308 03/16/24 0150  Weight: 96.2 kg 88.8 kg    Examination:  General exam: Appears calm and comfortable, chronically ill Respiratory system: Clear to auscultation. Respiratory effort normal. Cardiovascular system: S1 & S2 heard, RRR.   Gastrointestinal system: Abdomen is nondistended, soft and nontender.  Central nervous system: Alert, not oriented, moving all 4 Extremities: Symmetric 5 x 5 power. Skin: erythema and swelling submandibular space, improving Psychiatry: demented, calm    Data Reviewed: I have personally reviewed following labs and  imaging studies  CBC: Recent Labs  Lab 03/15/24 1349 03/16/24 0510 03/17/24 0529  WBC 12.0* 11.3* 11.8*  NEUTROABS 7.9*  --   --   HGB 15.0 14.8 13.3  HCT 45.0 44.2 40.5  MCV 95.9 95.1 96.0  PLT 316 316 278   Basic Metabolic Panel: Recent Labs  Lab 03/15/24 1313 03/16/24 0510 03/17/24 0529  NA 137 139 140  K 3.1* 3.1* 3.2*  CL 98 99 105  CO2 26 27 25   GLUCOSE 120* 143* 115*  BUN 17 15 9   CREATININE 0.54 0.62 0.54  CALCIUM  9.2 9.1 8.4*  MG  2.4 2.2  --   PHOS 2.8  --   --    GFR: Estimated Creatinine Clearance: 57.7 mL/min (by C-G formula based on SCr of 0.54 mg/dL). Liver Function Tests: Recent Labs  Lab 03/15/24 1313  AST 35  ALT 27  ALKPHOS 58  BILITOT 1.0  PROT 8.2*  ALBUMIN 3.3*   No results for input(s): LIPASE, AMYLASE in the last 168 hours. No results for input(s): AMMONIA in the last 168 hours. Coagulation Profile: Recent Labs  Lab 03/16/24 0510  INR 1.0   Cardiac Enzymes: No results for input(s): CKTOTAL, CKMB, CKMBINDEX, TROPONINI in the last 168 hours. BNP (last 3 results) No results for input(s): PROBNP in the last 8760 hours. HbA1C: No results for input(s): HGBA1C in the last 72 hours. CBG: Recent Labs  Lab 03/16/24 1224 03/17/24 0724  GLUCAP 120* 117*   Lipid Profile: No results for input(s): CHOL, HDL, LDLCALC, TRIG, CHOLHDL, LDLDIRECT in the last 72 hours. Thyroid Function Tests: No results for input(s): TSH, T4TOTAL, FREET4, T3FREE, THYROIDAB in the last 72 hours. Anemia Panel: No results for input(s): VITAMINB12, FOLATE, FERRITIN, TIBC, IRON, RETICCTPCT in the last 72 hours. Urine analysis:    Component Value Date/Time   COLORURINE YELLOW 10/15/2021 1138   APPEARANCEUR CLEAR 10/15/2021 1138   LABSPEC 1.024 10/15/2021 1138   PHURINE 6.0 10/15/2021 1138   GLUCOSEU NEGATIVE 10/15/2021 1138   HGBUR SMALL (A) 10/15/2021 1138   BILIRUBINUR NEGATIVE 10/15/2021 1138   KETONESUR NEGATIVE 10/15/2021 1138   PROTEINUR NEGATIVE 10/15/2021 1138   NITRITE NEGATIVE 10/15/2021 1138   LEUKOCYTESUR NEGATIVE 10/15/2021 1138   Sepsis Labs: @LABRCNTIP (procalcitonin:4,lacticidven:4)  ) Recent Results (from the past 240 hours)  Culture, blood (Routine X 2) w Reflex to ID Panel     Status: None (Preliminary result)   Collection Time: 03/15/24  8:00 PM   Specimen: BLOOD  Result Value Ref Range Status   Specimen Description BLOOD BLOOD RIGHT  FOREARM  Final   Special Requests   Final    Blood Culture adequate volume BOTTLES DRAWN AEROBIC AND ANAEROBIC   Culture   Final    NO GROWTH 2 DAYS Performed at [facility], [address], [place], [place] [address]    Report Status PENDING  Incomplete  Culture, blood (Routine X 2) w Reflex to ID Panel     Status: None (Preliminary result)   Collection Time: 03/15/24  8:15 PM   Specimen: BLOOD  Result Value Ref Range Status   Specimen Description BLOOD RIGHT ANTECUBITAL  Final   Special Requests   Final    BOTTLES DRAWN AEROBIC AND ANAEROBIC Blood Culture adequate volume   Culture   Final    NO GROWTH 2 DAYS Performed at [facility], [address] [place], [place] [address]    Report Status PENDING  Incomplete  Scheduled Meds:  baclofen  10 mg Oral QHS   divalproex   125 mg Oral BID   pantoprazole   40 mg Oral Daily   Continuous Infusions:  ampicillin-sulbactam (UNASYN) IV 3 g (03/17/24 1625)   vancomycin  1,250 mg (03/17/24 1307)     LOS: 2 days     Devaughn KATHEE Ban, MD [facility]   If 7PM-7AM, please contact night-coverage [email or web address] Password [email or web address] 03/17/2024, 4:49 PM

## 2024-03-17 NOTE — Plan of Care (Signed)
  Problem: Education: Goal: Knowledge of General Education information will improve Description: Including pain rating scale, medication(s)/side effects and non-pharmacologic comfort measures Outcome: Not Progressing   Problem: Health Behavior/Discharge Planning: Goal: Ability to manage health-related needs will improve Outcome: Not Progressing   Problem: Clinical Measurements: Goal: Ability to maintain clinical measurements within normal limits will improve Outcome: Progressing Goal: Will remain free from infection Outcome: Progressing Goal: Diagnostic test results will improve Outcome: Progressing Goal: Respiratory complications will improve Outcome: Progressing Goal: Cardiovascular complication will be avoided Outcome: Progressing   Problem: Activity: Goal: Risk for activity intolerance will decrease Outcome: Not Progressing   Problem: Nutrition: Goal: Adequate nutrition will be maintained Outcome: Progressing   Problem: Coping: Goal: Level of anxiety will decrease Outcome: Progressing   Problem: Elimination: Goal: Will not experience complications related to bowel motility Outcome: Progressing Goal: Will not experience complications related to urinary retention Outcome: Progressing   Problem: Pain Managment: Goal: General experience of comfort will improve and/or be controlled Outcome: Progressing   Problem: Safety: Goal: Ability to remain free from injury will improve Outcome: Progressing   Problem: Skin Integrity: Goal: Risk for impaired skin integrity will decrease Outcome: Progressing

## 2024-03-17 NOTE — Plan of Care (Signed)
  Problem: Health Behavior/Discharge Planning: Goal: Ability to manage health-related needs will improve Outcome: Progressing   Problem: Clinical Measurements: Goal: Ability to maintain clinical measurements within normal limits will improve Outcome: Progressing Goal: Will remain Olivia Martin from infection Outcome: Progressing Goal: Diagnostic test results will improve Outcome: Progressing Goal: Respiratory complications will improve Outcome: Progressing Goal: Cardiovascular complication will be avoided Outcome: Progressing   Problem: Activity: Goal: Risk for activity intolerance will decrease Outcome: Progressing   Problem: Nutrition: Goal: Adequate nutrition will be maintained Outcome: Progressing   Problem: Coping: Goal: Level of anxiety will decrease Outcome: Progressing   Problem: Elimination: Goal: Will not experience complications related to bowel motility Outcome: Progressing Goal: Will not experience complications related to urinary retention Outcome: Progressing   Problem: Pain Managment: Goal: General experience of comfort will improve and/or be controlled Outcome: Progressing   Problem: Safety: Goal: Ability to remain Olivia Martin from injury will improve Outcome: Progressing   Problem: Skin Integrity: Goal: Risk for impaired skin integrity will decrease Outcome: Progressing   Problem: Education: Goal: Knowledge of General Education information will improve Description: Including pain rating scale, medication(s)/side effects and non-pharmacologic comfort measures Outcome: Not Progressing Note: Pt baseline dementia and unable to learn; family receptive to education.

## 2024-03-18 ENCOUNTER — Other Ambulatory Visit: Payer: Self-pay

## 2024-03-18 DIAGNOSIS — K122 Cellulitis and abscess of mouth: Secondary | ICD-10-CM | POA: Diagnosis not present

## 2024-03-18 LAB — BASIC METABOLIC PANEL WITH GFR
Anion gap: 13 (ref 5–15)
BUN: 9 mg/dL (ref 8–23)
CO2: 22 mmol/L (ref 22–32)
Calcium: 8.4 mg/dL — ABNORMAL LOW (ref 8.9–10.3)
Chloride: 104 mmol/L (ref 98–111)
Creatinine, Ser: 0.51 mg/dL (ref 0.44–1.00)
GFR, Estimated: >60 mL/min (ref >60)
Glucose, Bld: 121 mg/dL — ABNORMAL HIGH (ref 70–99)
Potassium: 3.6 mmol/L (ref 3.5–5.1)
Sodium: 139 mmol/L (ref 135–145)

## 2024-03-18 LAB — GLUCOSE, CAPILLARY
Glucose-Capillary: 115 mg/dL — ABNORMAL HIGH (ref 70–99)
Glucose-Capillary: 118 mg/dL — ABNORMAL HIGH (ref 70–99)
Glucose-Capillary: 115 mg/dL — ABNORMAL HIGH (ref 70–99)

## 2024-03-18 LAB — CBC
HCT: 38.6 % (ref 36.0–46.0)
Hemoglobin: 12.7 g/dL (ref 12.0–15.0)
MCH: 31.8 pg (ref 26.0–34.0)
MCHC: 32.9 g/dL (ref 30.0–36.0)
MCV: 96.7 fL (ref 80.0–100.0)
Platelets: 280 K/uL (ref 150–400)
RBC: 3.99 MIL/uL (ref 3.87–5.11)
RDW: 12.7 % (ref 11.5–15.5)
WBC: 11 K/uL — ABNORMAL HIGH (ref 4.0–10.5)
nRBC: 0 % (ref 0.0–0.2)

## 2024-03-18 NOTE — Plan of Care (Signed)

## 2024-03-18 NOTE — Plan of Care (Signed)
   Problem: Clinical Measurements: Goal: Diagnostic test results will improve Outcome: Progressing   Problem: Pain Managment: Goal: General experience of comfort will improve and/or be controlled Outcome: Progressing   Problem: Safety: Goal: Ability to remain free from injury will improve Outcome: Progressing

## 2024-03-18 NOTE — Consult Note (Addendum)
 WOC Nurse Consult Note: Reason for Consult: Requested to assess injury on sacrum. Wound type: PI stage 2 on sacrum. Pressure Injury POA: Yes Measurement: aprox. 2 cm x 1.5 cm x 0.1 cm Wound bed: 100% red Drainage (amount, consistency, odor) Minimum amount, serous. Periwound: intact, slightly maceration. Dressing procedure/placement/frequency: Cleanse with saline, pat dry. Apply Xeroform daily to the wound bed, top with foam dressing. Ok to lift and reapply the Xeroform. The foam can stay up to 3 day if not saturated or soiling.  WOC team will not plan to follow further. Please reconsult if further assistance is needed. Thank-you,  Lela Holm RN, CNS, [employer], MSN.  (Phone [phone])

## 2024-03-18 NOTE — Progress Notes (Signed)
  [facility] Liaison Note:   Olivia Martin is a current hospice patient with [facility] with a terminal diagnosis of Alzheimer's Disease. Patient has a knot under her chin that has been getting worse despite treatment with oral antibiotics. Family made decision to transport her to the ED for further evaluation. 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 Per Dr. Norleen Laurence with [facility] this is a related hospital admission. Patient is a DNR.   Per chart review, patient remains GIP appropriate as she requires IV antibiotics QID for Ludwig Angina. Does not appear to report any pain or respiratory compromise.  Vital Signs: Temp 98.1 (route undocumented), BP 148/119, HR 88, RR 16, SpO2 94% on RA  I/O: 730/900  Abnormal Labs: Glucose 121, Calcium  8.4, WBC 11.0  Diagnostics: None New  IV/PRN Meds: Unasyn 3 G IV Q 6 hours IV, Vancomycin  1250 mg IV (last given 1307 10.24.205 and now discontinued)  Assessment and Plan per Dr. Dow note dated 10.25.2025 Principal Problem:   Ludwig angina Active Problems:   Essential hypertension   Hyperlipidemia   Alzheimer's dementia (HCC)   Hypokalemia   Obesity (BMI 30-39.9)   Pressure injury of skin   # Ludwig angina Abscess anterior floor of mouth musculature 2x2 cm, also submental soft tissue swelling. EDP unable to drain fluid for culture. No respiratory compromise at the moment. Failed outpt keflex. ENT advises abx, hopes to avoid surgery. Does appear to be improving. Mrsa swab neg - continue unasyn, vanc now discontinued - discussed w/ ent, will repeat ct tomorrow, if drainable fluid collection will discuss w/ IR, if not and improving likely home w/ oral abx   # Vanc hypersensitivity reaction Resolved with slowing infusion   # Advanced dementia Under hospice care, goals remain treat the treatable, daughter affirms does want  to treat this infection, would consent to surgery if that's advised. Bedbound at baseline, very limited communication. - home depakote  bid, trazodone  when tolerating po   # Hypokalemia Home hydrochlorothiazide likely contributory. Resolved.   # HTN Bp controlled - home hydrochlorothiazide on hold   Discharge Planning: ongoing; pending completion of IVAB Family Contact: none today IDT: updated Goals of Care: clear, treat the treatable; DNR  Please call with any hospice related questions or concerns.  Thank you, Randine Nail, BSN, [facility] Liaison  [phone]

## 2024-03-18 NOTE — Care Plan (Signed)
 Mattress order to replace (specialty bed)

## 2024-03-18 NOTE — Progress Notes (Signed)
 PROGRESS NOTE    [name]  FMW:[id number] DOB: Feb 21, 1941 DOA: 03/15/2024 PCP: Valora Lynwood FALCON, MD     Brief Narrative:   From admission h and p  Olivia Martin is a 83 y.o. female with medical history significant of HTN, HLD, dementia, depression, obesity, currently under hospice care, who presents with neck swelling and pain.   Per her daughter at the bedside, pt started having swelling and pain in the right side of her neck, under right chin area with a knot 1 weak ago.  Pt was seen by hospice.  They switched her ciprofloxacin which is for UTI treatment to Keflex.  Patient feels better in terms of right-sided neck pain, but she then developed swelling and pain in the front neck under her submandible area.  Patient does not have fever or chills. No chest pain, cough, SOB.  No nausea, vomiting, diarrhea or abdominal pain.  Her symptoms of UTI have resolved after taking 4 days of Cipro.  Patient's mental status is at baseline per her daughter.    Assessment & Plan:   Principal Problem:   Ludwig angina Active Problems:   Essential hypertension   Hyperlipidemia   Alzheimer's dementia (HCC)   Hypokalemia   Obesity (BMI 30-39.9)   Pressure injury of skin  # Ludwig angina Abscess anterior floor of mouth musculature 2x2 cm, also submental soft tissue swelling. EDP unable to drain fluid for culture. No respiratory compromise at the moment. Failed outpt keflex. ENT advises abx, hopes to avoid surgery. Does appear to be improving. Mrsa swab neg - continue unasyn, vanc now discontinued - discussed w/ ent, will repeat ct tomorrow, if drainable fluid collection will discuss w/ IR, if not and improving likely home w/ oral abx  # Vanc hypersensitivity reaction Resolved with slowing infusion  # Advanced dementia Under hospice care, goals remain treat the treatable, daughter affirms does want to treat this infection, would consent to surgery if that's advised. Bedbound at baseline,  very limited communication. - home depakote  bid, trazodone  when tolerating po  # Hypokalemia Home hydrochlorothiazide likely contributory. Resolved.  # HTN Bp controlled - home hydrochlorothiazide on hold   DVT prophylaxis: SCDs Code Status: dnr/dni Family Communication: daughter at bedside 10/25  Level of care: Med-Surg Status is: inpg     Consultants:  ent  Procedures: None thus far  Antimicrobials:  See above    Subjective: No complaints, demented  Objective: Vitals:   03/17/24 2009 03/18/24 0443 03/18/24 0719 03/18/24 1600  BP: 114/80 (!) 107/59 (!) 116/52 (!) 148/119  Pulse: 76 70 70 88  Resp: 19 18 16 16   Temp: 99 F (37.2 C) 99.3 F (37.4 C) 99.6 F (37.6 C) 98.1 F (36.7 C)  TempSrc:      SpO2: 96% 93% 93% 94%  Weight:      Height:        Intake/Output Summary (Last 24 hours) at 03/18/2024 1751 Last data filed at 03/18/2024 1550 Gross per 24 hour  Intake 50 ml  Output 900 ml  Net -850 ml   Filed Weights   03/15/24 1308 03/16/24 0150  Weight: 96.2 kg 88.8 kg    Examination:  General exam: Appears calm and comfortable, chronically ill Respiratory system: Clear to auscultation. Respiratory effort normal. Cardiovascular system: S1 & S2 heard, RRR.   Gastrointestinal system: Abdomen is nondistended, soft and nontender.  Central nervous system: Alert, not oriented, moving all 4 Extremities: Symmetric 5 x 5 power. Skin: erythema and swelling submandibular  space, improving Psychiatry: demented, calm    Data Reviewed: I have personally reviewed following labs and imaging studies  CBC: Recent Labs  Lab 03/15/24 1349 03/16/24 0510 03/17/24 0529 03/18/24 0404  WBC 12.0* 11.3* 11.8* 11.0*  NEUTROABS 7.9*  --   --   --   HGB 15.0 14.8 13.3 12.7  HCT 45.0 44.2 40.5 38.6  MCV 95.9 95.1 96.0 96.7  PLT 316 316 278 280   Basic Metabolic Panel: Recent Labs  Lab 03/15/24 1313 03/16/24 0510 03/17/24 0529 03/18/24 0404  NA 137 139  140 139  K 3.1* 3.1* 3.2* 3.6  CL 98 99 105 104  CO2 26 27 25 22   GLUCOSE 120* 143* 115* 121*  BUN 17 15 9 9   CREATININE 0.54 0.62 0.54 0.51  CALCIUM  9.2 9.1 8.4* 8.4*  MG 2.4 2.2  --   --   PHOS 2.8  --   --   --    GFR: Estimated Creatinine Clearance: 57.7 mL/min (by C-G formula based on SCr of 0.51 mg/dL). Liver Function Tests: Recent Labs  Lab 03/15/24 1313  AST 35  ALT 27  ALKPHOS 58  BILITOT 1.0  PROT 8.2*  ALBUMIN 3.3*   No results for input(s): LIPASE, AMYLASE in the last 168 hours. No results for input(s): AMMONIA in the last 168 hours. Coagulation Profile: Recent Labs  Lab 03/16/24 0510  INR 1.0   Cardiac Enzymes: No results for input(s): CKTOTAL, CKMB, CKMBINDEX, TROPONINI in the last 168 hours. BNP (last 3 results) No results for input(s): PROBNP in the last 8760 hours. HbA1C: No results for input(s): HGBA1C in the last 72 hours. CBG: Recent Labs  Lab 03/16/24 1224 03/17/24 0724 03/18/24 0806 03/18/24 1141 03/18/24 1723  GLUCAP 120* 117* 115* 118* 115*   Lipid Profile: No results for input(s): CHOL, HDL, LDLCALC, TRIG, CHOLHDL, LDLDIRECT in the last 72 hours. Thyroid Function Tests: No results for input(s): TSH, T4TOTAL, FREET4, T3FREE, THYROIDAB in the last 72 hours. Anemia Panel: No results for input(s): VITAMINB12, FOLATE, FERRITIN, TIBC, IRON, RETICCTPCT in the last 72 hours. Urine analysis:    Component Value Date/Time   COLORURINE YELLOW 10/15/2021 1138   APPEARANCEUR CLEAR 10/15/2021 1138   LABSPEC 1.024 10/15/2021 1138   PHURINE 6.0 10/15/2021 1138   GLUCOSEU NEGATIVE 10/15/2021 1138   HGBUR SMALL (A) 10/15/2021 1138   BILIRUBINUR NEGATIVE 10/15/2021 1138   KETONESUR NEGATIVE 10/15/2021 1138   PROTEINUR NEGATIVE 10/15/2021 1138   NITRITE NEGATIVE 10/15/2021 1138   LEUKOCYTESUR NEGATIVE 10/15/2021 1138   Sepsis Labs: @LABRCNTIP (procalcitonin:4,lacticidven:4)  ) Recent  Results (from the past 240 hours)  Culture, blood (Routine X 2) w Reflex to ID Panel     Status: None (Preliminary result)   Collection Time: 03/15/24  8:00 PM   Specimen: BLOOD  Result Value Ref Range Status   Specimen Description BLOOD BLOOD RIGHT FOREARM  Final   Special Requests   Final    Blood Culture adequate volume BOTTLES DRAWN AEROBIC AND ANAEROBIC   Culture   Final    NO GROWTH 3 DAYS Performed at [facility], [address] [name], [place] [address]    Report Status PENDING  Incomplete  Culture, blood (Routine X 2) w Reflex to ID Panel     Status: None (Preliminary result)   Collection Time: 03/15/24  8:15 PM   Specimen: BLOOD  Result Value Ref Range Status   Specimen Description BLOOD RIGHT ANTECUBITAL  Final   Special Requests   Final  BOTTLES DRAWN AEROBIC AND ANAEROBIC Blood Culture adequate volume   Culture   Final    NO GROWTH 3 DAYS Performed at [facility], [address] [place], [place] [address]    Report Status PENDING  Incomplete  MRSA Next Gen by PCR, Nasal     Status: None   Collection Time: 03/17/24  4:49 PM   Specimen: Nasal Mucosa; Nasal Swab  Result Value Ref Range Status   MRSA by PCR Next Gen NOT DETECTED NOT DETECTED Final    Comment: (NOTE) The GeneXpert MRSA Assay (FDA approved for NASAL specimens only), is one component of a comprehensive MRSA colonization surveillance program. It is not intended to diagnose MRSA infection nor to guide or monitor treatment for MRSA infections. Test performance is not FDA approved in patients less than 66 years old. Performed at [facility], [address] [place], [place] [address]                 Scheduled Meds:  baclofen  10 mg Oral QHS   divalproex   125 mg Oral BID   pantoprazole   40 mg Oral Daily   Continuous Infusions:  ampicillin-sulbactam (UNASYN) IV 3 g (03/18/24 1611)     LOS: 3 days     Devaughn KATHEE Ban, MD [facility]   If  7PM-7AM, please contact night-coverage [email or web address] Password [facility] 03/18/2024, 5:51 PM

## 2024-03-19 ENCOUNTER — Inpatient Hospital Stay

## 2024-03-19 DIAGNOSIS — K122 Cellulitis and abscess of mouth: Secondary | ICD-10-CM | POA: Diagnosis not present

## 2024-03-19 LAB — BASIC METABOLIC PANEL WITH GFR
Anion gap: 8 (ref 5–15)
BUN: 10 mg/dL (ref 8–23)
CO2: 22 mmol/L (ref 22–32)
Calcium: 8.1 mg/dL — ABNORMAL LOW (ref 8.9–10.3)
Chloride: 108 mmol/L (ref 98–111)
Creatinine, Ser: 0.43 mg/dL — ABNORMAL LOW (ref 0.44–1.00)
GFR, Estimated: >60 mL/min (ref >60)
Glucose, Bld: 111 mg/dL — ABNORMAL HIGH (ref 70–99)
Potassium: 3.3 mmol/L — ABNORMAL LOW (ref 3.5–5.1)
Sodium: 138 mmol/L (ref 135–145)

## 2024-03-19 LAB — CBC
HCT: 36.9 % (ref 36.0–46.0)
Hemoglobin: 12 g/dL (ref 12.0–15.0)
MCH: 32 pg (ref 26.0–34.0)
MCHC: 32.5 g/dL (ref 30.0–36.0)
MCV: 98.4 fL (ref 80.0–100.0)
Platelets: 235 K/uL (ref 150–400)
RBC: 3.75 MIL/uL — ABNORMAL LOW (ref 3.87–5.11)
RDW: 12.7 % (ref 11.5–15.5)
WBC: 10.7 K/uL — ABNORMAL HIGH (ref 4.0–10.5)
nRBC: 0 % (ref 0.0–0.2)

## 2024-03-19 LAB — GLUCOSE, CAPILLARY
Glucose-Capillary: 111 mg/dL — ABNORMAL HIGH (ref 70–99)
Glucose-Capillary: 99 mg/dL (ref 70–99)

## 2024-03-19 MED ORDER — ENOXAPARIN SODIUM 40 MG/0.4ML IJ SOSY
40.0000 mg | PREFILLED_SYRINGE | INTRAMUSCULAR | Status: DC
  Administered 2024-03-19: 40 mg via SUBCUTANEOUS
  Filled 2024-03-19: qty 0.4

## 2024-03-19 MED ORDER — SERTRALINE HCL 50 MG PO TABS
50.0000 mg | ORAL_TABLET | Freq: Every day | ORAL | Status: DC
  Administered 2024-03-19 – 2024-03-20 (×2): 50 mg via ORAL
  Filled 2024-03-19 (×2): qty 1

## 2024-03-19 MED ORDER — ENSURE PLUS HIGH PROTEIN PO LIQD
237.0000 mL | Freq: Two times a day (BID) | ORAL | Status: DC
  Administered 2024-03-19 – 2024-03-20 (×3): 237 mL via ORAL

## 2024-03-19 MED ORDER — IOHEXOL 300 MG/ML  SOLN
100.0000 mL | Freq: Once | INTRAMUSCULAR | Status: AC | PRN
  Administered 2024-03-19: 100 mL via INTRAVENOUS

## 2024-03-19 MED ORDER — LOPERAMIDE HCL 2 MG PO CAPS
2.0000 mg | ORAL_CAPSULE | ORAL | Status: DC | PRN
  Administered 2024-03-20: 2 mg via ORAL
  Filled 2024-03-19: qty 1

## 2024-03-19 NOTE — Progress Notes (Signed)
 [facility] Liaison Note:   Olivia Martin is a current hospice patient with [facility] with a terminal diagnosis of Alzheimer's Disease. Patient has a knot under her chin that has been getting worse despite treatment with oral antibiotics. Family made decision to transport her to the ED for further evaluation. 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 Per Dr. Norleen Laurence with [facility] this is a related hospital admission. Patient is a DNR.  Met with patient and daughter at the bedside today.  Patient resting in no apparent distress.  CT of the face completed today.      Per chart review, patient remains GIP appropriate as she requires IV antibiotics QID for Ludwig Angina.    Vital Signs: Temp 97.1, BP 148/107, HR 65, RR 16, SpO2 92% on RA   I/O: 1300/450   Abnormal Labs: Potassium 3.3,  Glucose 111, Creatinine 0.43,  Calcium  8.1  WBC 10.7,  RBC 3.75   Diagnostics: CT Face with contrast 03/19/2024 08:31:20 AM   TECHNIQUE: CT of the face was performed with the administration of 100 mL of iohexol (OMNIPAQUE) 300 MG/ML solution. Multiplanar reformatted images are provided for review. Automated exposure control, iterative reconstruction, and/or weight based adjustment of the mA/kV was utilized to reduce the radiation dose to as low as reasonably achievable.   COMPARISON: Face CT with contrast 03/15/2024.   CLINICAL HISTORY: 83 year old female. Follow-up abscess evaluation for drainable collection. Abscess.   FINDINGS:   AERODIGESTIVE TRACT: Oval circumscribed anterior sublingual space fluid collection appears to partially involve the adjacent mylohyoid muscles and has not significantly changed in size or configuration measuring 21 x 15 x 16 mm (AP x transverse x CC). Estimated volume is 3 mL. Associated rim enhancement is less conspicuous. There is no widespread  sublingual space edema. Submandibular spaces and glands are within normal limits. Masticator spaces and parotid spaces are within normal limits.   SALIVARY GLANDS: Submandibular glands are within normal limits. Parotid glands are within normal limits.   LYMPH NODES: No suspicious cervical lymphadenopathy.   SOFT TISSUES: Overlying mental protuberance soft tissue swelling has regressed. No progressive superficial soft tissue inflammation. No soft tissue gas.   BRAIN, ORBITS AND SINUSES: Stable visible brain parenchyma. Paranasal sinuses remain well aerated. There is stable bubbly opacity and some fluid in both sphenoid sinuses. Middle ears and mastoids are well aerated.   BONES: Chronic dental disease and dental postoperative changes. Bulky severe lucency along the posterior right mandible molar, stable. Similar lucency left mandible canine on series 4 image 27. No acute abnormality. No suspicious bone lesion.   VASCULATURE: Major vascular structures in the visible neck and at the skull base are enhancing and appear to be patent.   IMPRESSION: 1. Mandible dental disease with regressed dental protuberance superficial soft tissue inflammation and phlegmon. Unclear whether this could be related to nearby left mandible canine dental disease. 2. Stable anterior sublingual space fluid collection (estimated volume 3 mL). Associated rim enhancement is less conspicuous, but this remains most compatible with Abscess (a ranula was also considered but felt less likely). No widespread sublingual space inflammation. 3. Stable mild sphenoid sinus inflammatory changes.   Electronically signed by: Helayne Hurst MD 03/19/2024 08:59 AM EDT RP     IV/PRN Meds: Unasyn 3 G IV Q 6 hours IV.    Assessment and Plan per Dr. Dow note dated 10.26.2025 Principal Problem:   Ludwig angina Active  Problems:   Essential hypertension   Hyperlipidemia   Alzheimer's dementia (HCC)   Hypokalemia    Obesity (BMI 30-39.9)   Pressure injury of skin   # Ludwig angina Abscess anterior floor of mouth musculature 2x2 cm, also submental soft tissue swelling. EDP unable to drain fluid for culture. No respiratory compromise at the moment. Failed outpt keflex. ENT advises abx, hopes to avoid surgery. Does appear to be improving. Mrsa swab neg - continue unasyn, vanc now discontinued - discussed w/ ent, repeat ct performed, showing stable 3cc sublingual fluid collection, discussed with IR, they will attempt u/s guided aspiration tomorrow    # Vanc hypersensitivity reaction Resolved with slowing infusion   # Advanced dementia Under hospice care, goals remain treat the treatable, daughter affirms does want to treat this infection, would consent to surgery if that's advised. Bedbound at baseline, very limited communication. - home depakote  bid, trazodone  when tolerating po   # Hypokalemia Home hydrochlorothiazide likely contributory. Resolved.   # HTN Bp controlled - home hydrochlorothiazide on hold   Discharge Planning: ongoing; pending completion of IVAB Family Contact: Daughter visiting patient at the bedside.   IDT: updated Goals of Care: clear, treat the treatable; DNR   Please call with any hospice related questions or concerns.  [facility] Liaison [phone]

## 2024-03-19 NOTE — Plan of Care (Signed)
   Problem: Education: Goal: Knowledge of General Education information will improve Description: Including pain rating scale, medication(s)/side effects and non-pharmacologic comfort measures Outcome: Not Progressing   Problem: Health Behavior/Discharge Planning: Goal: Ability to manage health-related needs will improve Outcome: Not Progressing

## 2024-03-19 NOTE — Progress Notes (Signed)
 PROGRESS NOTE    Olivia Martin  FMW:[id number] DOB: 1941-01-27 DOA: 03/15/2024 PCP: Valora Lynwood FALCON, MD     Brief Narrative:   From admission h and p  Olivia Martin is a 83 y.o. female with medical history significant of HTN, HLD, dementia, depression, obesity, currently under hospice care, who presents with neck swelling and pain.   Per her daughter at the bedside, pt started having swelling and pain in the right side of her neck, under right chin area with a knot 1 weak ago.  Pt was seen by hospice.  They switched her ciprofloxacin which is for UTI treatment to Keflex.  Patient feels better in terms of right-sided neck pain, but she then developed swelling and pain in the front neck under her submandible area.  Patient does not have fever or chills. No chest pain, cough, SOB.  No nausea, vomiting, diarrhea or abdominal pain.  Her symptoms of UTI have resolved after taking 4 days of Cipro.  Patient's mental status is at baseline per her daughter.    Assessment & Plan:   Principal Problem:   Ludwig angina Active Problems:   Essential hypertension   Hyperlipidemia   Alzheimer's dementia (HCC)   Hypokalemia   Obesity (BMI 30-39.9)   Pressure injury of skin  # Ludwig angina Abscess anterior floor of mouth musculature 2x2 cm, also submental soft tissue swelling. EDP unable to drain fluid for culture. No respiratory compromise at the moment. Failed outpt keflex. ENT advises abx, hopes to avoid surgery. Does appear to be improving. Mrsa swab neg - continue unasyn, vanc now discontinued - discussed w/ ent, repeat ct performed, showing stable 3cc sublingual fluid collection, discussed with IR, they will attempt u/s guided aspiration tomorrow   # Vanc hypersensitivity reaction Resolved with slowing infusion  # Advanced dementia Under hospice care, goals remain treat the treatable, daughter affirms does want to treat this infection, would consent to surgery if that's advised.  Bedbound at baseline, very limited communication. - home depakote  bid, trazodone  when tolerating po  # Hypokalemia Home hydrochlorothiazide likely contributory. Resolved.  # HTN Bp controlled - home hydrochlorothiazide on hold   DVT prophylaxis: lovenox  Code Status: dnr/dni Family Communication: daughter at bedside 10/26  Level of care: Med-Surg Status is: inpg     Consultants:  ENT, IR  Procedures: None thus far  Antimicrobials:  See above    Subjective: No complaints, demented  Objective: Vitals:   03/18/24 1600 03/18/24 2059 03/19/24 0346 03/19/24 0749  BP: (!) 148/119 (!) 159/113 (!) 111/40 (!) 148/107  Pulse: 88 82 72 65  Resp: 16 16 16 16   Temp: 98.1 F (36.7 C) 98.3 F (36.8 C) 98.8 F (37.1 C) (!) 97.1 F (36.2 C)  TempSrc:      SpO2: 94% 94% 94% 92%  Weight:      Height:        Intake/Output Summary (Last 24 hours) at 03/19/2024 1307 Last data filed at 03/18/2024 1915 Gross per 24 hour  Intake 1250 ml  Output 450 ml  Net 800 ml   Filed Weights   03/15/24 1308 03/16/24 0150  Weight: 96.2 kg 88.8 kg    Examination:  General exam: Appears calm and comfortable, chronically ill Respiratory system: Clear to auscultation. Respiratory effort normal. Cardiovascular system: S1 & S2 heard, RRR.   Gastrointestinal system: Abdomen is nondistended, soft and nontender.  Central nervous system: Alert, not oriented, moving all 4 Extremities: Symmetric 5 x 5 power. Skin: erythema and  swelling submandibular space, improving From 10/25  Psychiatry: demented, calm    Data Reviewed: I have personally reviewed following labs and imaging studies  CBC: Recent Labs  Lab 03/15/24 1349 03/16/24 0510 03/17/24 0529 03/18/24 0404 03/19/24 0501  WBC 12.0* 11.3* 11.8* 11.0* 10.7*  NEUTROABS 7.9*  --   --   --   --   HGB 15.0 14.8 13.3 12.7 12.0  HCT 45.0 44.2 40.5 38.6 36.9  MCV 95.9 95.1 96.0 96.7 98.4  PLT 316 316 278 280 235   Basic Metabolic  Panel: Recent Labs  Lab 03/15/24 1313 03/16/24 0510 03/17/24 0529 03/18/24 0404 03/19/24 0501  NA 137 139 140 139 138  K 3.1* 3.1* 3.2* 3.6 3.3*  CL 98 99 105 104 108  CO2 26 27 25 22 22   GLUCOSE 120* 143* 115* 121* 111*  BUN 17 15 9 9 10   CREATININE 0.54 0.62 0.54 0.51 0.43*  CALCIUM  9.2 9.1 8.4* 8.4* 8.1*  MG 2.4 2.2  --   --   --   PHOS 2.8  --   --   --   --    GFR: Estimated Creatinine Clearance: 57.7 mL/min (A) (by C-G formula based on SCr of 0.43 mg/dL (L)). Liver Function Tests: Recent Labs  Lab 03/15/24 1313  AST 35  ALT 27  ALKPHOS 58  BILITOT 1.0  PROT 8.2*  ALBUMIN 3.3*   No results for input(s): LIPASE, AMYLASE in the last 168 hours. No results for input(s): AMMONIA in the last 168 hours. Coagulation Profile: Recent Labs  Lab 03/16/24 0510  INR 1.0   Cardiac Enzymes: No results for input(s): CKTOTAL, CKMB, CKMBINDEX, TROPONINI in the last 168 hours. BNP (last 3 results) No results for input(s): PROBNP in the last 8760 hours. HbA1C: No results for input(s): HGBA1C in the last 72 hours. CBG: Recent Labs  Lab 03/18/24 0806 03/18/24 1141 03/18/24 1723 03/19/24 0751 03/19/24 1158  GLUCAP 115* 118* 115* 111* 99   Lipid Profile: No results for input(s): CHOL, HDL, LDLCALC, TRIG, CHOLHDL, LDLDIRECT in the last 72 hours. Thyroid Function Tests: No results for input(s): TSH, T4TOTAL, FREET4, T3FREE, THYROIDAB in the last 72 hours. Anemia Panel: No results for input(s): VITAMINB12, FOLATE, FERRITIN, TIBC, IRON, RETICCTPCT in the last 72 hours. Urine analysis:    Component Value Date/Time   COLORURINE YELLOW 10/15/2021 1138   APPEARANCEUR CLEAR 10/15/2021 1138   LABSPEC 1.024 10/15/2021 1138   PHURINE 6.0 10/15/2021 1138   GLUCOSEU NEGATIVE 10/15/2021 1138   HGBUR SMALL (A) 10/15/2021 1138   BILIRUBINUR NEGATIVE 10/15/2021 1138   KETONESUR NEGATIVE 10/15/2021 1138   PROTEINUR NEGATIVE  10/15/2021 1138   NITRITE NEGATIVE 10/15/2021 1138   LEUKOCYTESUR NEGATIVE 10/15/2021 1138   Sepsis Labs: @LABRCNTIP (procalcitonin:4,lacticidven:4)  ) Recent Results (from the past 240 hours)  Culture, blood (Routine X 2) w Reflex to ID Panel     Status: None (Preliminary result)   Collection Time: 03/15/24  8:00 PM   Specimen: BLOOD  Result Value Ref Range Status   Specimen Description BLOOD BLOOD RIGHT FOREARM  Final   Special Requests   Final    Blood Culture adequate volume BOTTLES DRAWN AEROBIC AND ANAEROBIC   Culture   Final    NO GROWTH 4 DAYS Performed at [facility], [address], [place], [place] [address]    Report Status PENDING  Incomplete  Culture, blood (Routine X 2) w Reflex to ID Panel     Status: None (Preliminary result)  Collection Time: 03/15/24  8:15 PM   Specimen: BLOOD  Result Value Ref Range Status   Specimen Description BLOOD RIGHT ANTECUBITAL  Final   Special Requests   Final    BOTTLES DRAWN AEROBIC AND ANAEROBIC Blood Culture adequate volume   Culture   Final    NO GROWTH 4 DAYS Performed at [facility], [address] [place], [place] [address]    Report Status PENDING  Incomplete  MRSA Next Gen by PCR, Nasal     Status: None   Collection Time: 03/17/24  4:49 PM   Specimen: Nasal Mucosa; Nasal Swab  Result Value Ref Range Status   MRSA by PCR Next Gen NOT DETECTED NOT DETECTED Final    Comment: (NOTE) The GeneXpert MRSA Assay (FDA approved for NASAL specimens only), is one component of a comprehensive MRSA colonization surveillance program. It is not intended to diagnose MRSA infection nor to guide or monitor treatment for MRSA infections. Test performance is not FDA approved in patients less than 45 years old. Performed at [facility], [address] [place], [place] [address]                 Scheduled Meds:  baclofen  10 mg Oral QHS   divalproex   125 mg Oral BID   feeding supplement   237 mL Oral BID BM   pantoprazole   40 mg Oral Daily   sertraline  50 mg Oral Daily   Continuous Infusions:  ampicillin-sulbactam (UNASYN) IV 3 g (03/19/24 1019)     LOS: 4 days     Devaughn KATHEE Ban, MD [facility]   If 7PM-7AM, please contact night-coverage [email or web address] Password [email or web address] 03/19/2024, 1:07 PM

## 2024-03-19 NOTE — Plan of Care (Signed)
   Problem: Safety: Goal: Ability to remain free from injury will improve Outcome: Progressing   Problem: Skin Integrity: Goal: Risk for impaired skin integrity will decrease Outcome: Progressing

## 2024-03-20 ENCOUNTER — Other Ambulatory Visit: Payer: Self-pay

## 2024-03-20 ENCOUNTER — Inpatient Hospital Stay

## 2024-03-20 DIAGNOSIS — K122 Cellulitis and abscess of mouth: Secondary | ICD-10-CM | POA: Diagnosis not present

## 2024-03-20 LAB — BASIC METABOLIC PANEL WITH GFR
Anion gap: 10 (ref 5–15)
BUN: 11 mg/dL (ref 8–23)
CO2: 25 mmol/L (ref 22–32)
Calcium: 8.3 mg/dL — ABNORMAL LOW (ref 8.9–10.3)
Chloride: 106 mmol/L (ref 98–111)
Creatinine, Ser: 0.48 mg/dL (ref 0.44–1.00)
GFR, Estimated: >60 mL/min (ref >60)
Glucose, Bld: 103 mg/dL — ABNORMAL HIGH (ref 70–99)
Potassium: 3.1 mmol/L — ABNORMAL LOW (ref 3.5–5.1)
Sodium: 141 mmol/L (ref 135–145)

## 2024-03-20 LAB — PROTIME-INR
INR: 1.1 (ref 0.8–1.2)
Prothrombin Time: 15 s (ref 11.4–15.2)

## 2024-03-20 LAB — CBC
HCT: 36.4 % (ref 36.0–46.0)
Hemoglobin: 12.2 g/dL (ref 12.0–15.0)
MCH: 32.5 pg (ref 26.0–34.0)
MCHC: 33.5 g/dL (ref 30.0–36.0)
MCV: 97.1 fL (ref 80.0–100.0)
Platelets: 228 K/uL (ref 150–400)
RBC: 3.75 MIL/uL — ABNORMAL LOW (ref 3.87–5.11)
RDW: 12.6 % (ref 11.5–15.5)
WBC: 8.3 K/uL (ref 4.0–10.5)
nRBC: 0 % (ref 0.0–0.2)

## 2024-03-20 LAB — CULTURE, BLOOD (ROUTINE X 2)
Culture: NO GROWTH
Culture: NO GROWTH
Special Requests: ADEQUATE
Special Requests: ADEQUATE

## 2024-03-20 LAB — GLUCOSE, CAPILLARY
Glucose-Capillary: 126 mg/dL — ABNORMAL HIGH (ref 70–99)
Glucose-Capillary: 111 mg/dL — ABNORMAL HIGH (ref 70–99)

## 2024-03-20 MED ORDER — ORAL CARE MOUTH RINSE: 15.0000 mL | OROMUCOSAL | Status: DC | PRN

## 2024-03-20 MED ORDER — MIDAZOLAM HCL 2 MG/2ML IJ SOLN
INTRAMUSCULAR | Status: AC
  Filled 2024-03-20: qty 2

## 2024-03-20 MED ORDER — LIDOCAINE HCL (PF) 1 % IJ SOLN
5.0000 mL | Freq: Once | INTRAMUSCULAR | Status: AC
Start: 2024-03-20 — End: 2024-03-20
  Administered 2024-03-20: 5 mL via INTRADERMAL

## 2024-03-20 MED ORDER — FENTANYL CITRATE (PF) 100 MCG/2ML IJ SOLN
INTRAMUSCULAR | Status: AC
  Filled 2024-03-20: qty 2

## 2024-03-20 MED ORDER — ORAL CARE MOUTH RINSE
15.0000 mL | OROMUCOSAL | Status: DC
  Administered 2024-03-20 (×2): 15 mL via OROMUCOSAL

## 2024-03-20 MED ORDER — AMOXICILLIN-POT CLAVULANATE 875-125 MG PO TABS
1.0000 | ORAL_TABLET | Freq: Two times a day (BID) | ORAL | 0 refills | Status: AC
  Filled 2024-03-20: qty 20, 10d supply, fill #0

## 2024-03-20 NOTE — Progress Notes (Signed)
 Patient here in [place]  today for aspiration submental per Dr Luverne, timeout done as right patient,right procedure, NPO prior to procedure, consent signed per daughter pre procedure with questions answered. Iv patient. Dr Luverne able to do procedure with versed 0.5 mg IV, vitals stable pre and post procedure. Sinus brady pre and post procedure.l oxygen saturation pre and post procedure = 94%. Taking back to room 147 at this time with report to care nurse at bedside.

## 2024-03-20 NOTE — Consult Note (Signed)
 Chief Complaint:  Sublingual fluid collection   Procedure: Percutaneous Aspiration  Referring Provider(s): Dr. Devaughn Ban  Supervising Physician: Luverne Aran  Patient Status: [facility] - In-pt  History of Present Illness: Olivia Martin is a 83 y.o. female with a history of dementia and obesity who is currently on hospice who presented to the ED on 10/22 with concerns for a submental abscess. She was brought in by her caregiver who reported increased complaints of lower dental pain. Caregiver reported swelling of the left aspect of the mouth that had spread to below the chin. Reported that patient had been on 2 antibiotics already for possible infection/abscessed tooth, but patient was having minimal improvement. Initial CT concerning for Ludwig angina with abscess noted in the anterior floor of the mouth as well as a nodular soft tissue swelling in the submental region with no focal drainable abscess. Per notes, ED physician attempted to aspirate the submental swelling, but was only able to get a few mLs of purulent material which was not enough for labs. ENT was consulted and patient was seen by Dr. Juengel who recommended no intervention at the time, but instead continued IV antibiotics. She was cleared to have soft food diet which she has been tolerating well. Repeat imaging was obtained on 10/26 which again showed a stable anterior sublingual space fluid collection. Imaging was discussed with Dr. Vanice who approved patient for possible aspiration.   She is currently resting in bed with her daughter at the bedside. Patient is arousable, but unresponsive to questions. Per daughter, she has been NPO since midnight. All questions and concerns answered at the bedside.   Patient is to remain DNR for the procedure  Past Medical History:  Diagnosis Date   Allergy    Arthritis    Hypertension    Lump or mass in breast 2014    Past Surgical History:  Procedure Laterality Date   ABDOMINAL  HYSTERECTOMY  1975   CARPAL TUNNEL RELEASE Bilateral [id number]   COLONOSCOPY  2004   [place], [place]    FOOT SURGERY Bilateral 2012,2013   TOTAL KNEE ARTHROPLASTY Right 2000   TOTAL KNEE ARTHROPLASTY Left 1997    Allergies: Prednisone and Vancomycin   Medications: Prior to Admission medications   Medication Sig Start Date End Date Taking? Authorizing Provider  baclofen (LIORESAL) 10 MG tablet Take 10 mg by mouth at bedtime.   Yes [provider]  cephALEXin (KEFLEX) 250 MG capsule Take 250 mg by mouth 4 (four) times daily. 03/10/24  Yes [provider]  divalproex  (DEPAKOTE ) 125 MG DR tablet Take 125 mg by mouth 2 (two) times daily.   Yes [provider]  hydrochlorothiazide (HYDRODIURIL) 25 MG tablet Take 25 mg by mouth daily.   Yes [provider]  oxyCODONE (OXY IR/ROXICODONE) 5 MG immediate release tablet Take 5 mg by mouth every 4 (four) hours as needed. 03/14/24  Yes [provider]  pantoprazole  (PROTONIX ) 40 MG tablet Take 40 mg by mouth daily.   Yes [provider]  traZODone  (DESYREL ) 50 MG tablet Take 50 mg by mouth at bedtime as needed. 03/07/24  Yes [provider]     Family History  Problem Relation Age of Onset   Cancer Mother        lung cancer, died at age 66   Cancer Brother 73       lung cancer   Cancer Sister 7       breast cancer   Breast cancer Sister  54    Social History   Socioeconomic History   Marital status: Single    Spouse name: Not on file   Number of children: Not on file   Years of education: Not on file   Highest education level: Not on file  Occupational History   Not on file  Tobacco Use   Smoking status: Former    Current packs/day: 1.00    Average packs/day: 1 pack/day for 20.0 years (20.0 ttl pk-yrs)    Types: Cigarettes   Smokeless tobacco: Never  Substance and Sexual Activity   Alcohol use: No   Drug use: No   Sexual activity: Not on file  Other Topics  Concern   Not on file  Social History Narrative   Not on file   Social Drivers of Health   Financial Resource Strain: Not on file  Food Insecurity: No Food Insecurity (03/16/2024)   Hunger Vital Sign    Worried About Running Out of Food in the Last Year: Never true    Ran Out of Food in the Last Year: Never true  Transportation Needs: No Transportation Needs (03/16/2024)   PRAPARE - Administrator, Civil Service (Medical): No    Lack of Transportation (Non-Medical): No  Physical Activity: Not on file  Stress: Not on file  Social Connections: Patient Unable To Answer (03/16/2024)   Social Connection and Isolation Panel    Frequency of Communication with Friends and Family: Patient unable to answer    Frequency of Social Gatherings with Friends and Family: Patient unable to answer    Attends Religious Services: Patient unable to answer    Active Member of Clubs or Organizations: Patient unable to answer    Attends Banker Meetings: Patient unable to answer    Marital Status: Patient unable to answer    Review of Systems  Reason unable to perform ROS: dementia.   Vital Signs: BP (!) 116/58 (BP Location: Left Arm)   Pulse 60   Temp 98.3 F (36.8 C) (Axillary)   Resp 18   Ht 5' 6 (1.676 m)   Wt 195 lb 12.3 oz (88.8 kg)   SpO2 98%   BMI 31.60 kg/m   Advance Care Plan: The advanced care plan/surrogate decision maker was discussed at the time of visit and documented in the medical record.    Physical Exam Vitals reviewed.  Constitutional:      Appearance: Normal appearance.     Comments: Arousable, but not responsive to questions  HENT:     Mouth/Throat:     Mouth: Mucous membranes are moist.     Comments: Unable to fully assess due to limited patient cooperation/understanding Small area of swelling about the size of a grape in the submental region where previous aspiration was attempted; hard, non-fluctuant; minimal bruising and erythema to the  area Cardiovascular:     Rate and Rhythm: Normal rate and regular rhythm.     Heart sounds: Normal heart sounds.  Pulmonary:     Breath sounds: Normal breath sounds.  Skin:    General: Skin is warm and dry.  Neurological:     Mental Status: Mental status is at baseline.     Imaging: CT MAXILLOFACIAL W CONTRAST Result Date: 03/19/2024 EXAM: CT Face with contrast 03/19/2024 08:31:20 AM TECHNIQUE: CT of the face was performed with the administration of 100 mL of iohexol (OMNIPAQUE) 300 MG/ML solution. Multiplanar reformatted images are provided for review. Automated exposure control, iterative reconstruction, and/or weight  based adjustment of the mA/kV was utilized to reduce the radiation dose to as low as reasonably achievable. COMPARISON: Face CT with contrast 03/15/2024. CLINICAL HISTORY: 83 year old female. Follow-up abscess evaluation for drainable collection. Abscess. FINDINGS: AERODIGESTIVE TRACT: Oval circumscribed anterior sublingual space fluid collection appears to partially involve the adjacent mylohyoid muscles and has not significantly changed in size or configuration measuring 21 x 15 x 16 mm (AP x transverse x CC). Estimated volume is 3 mL. Associated rim enhancement is less conspicuous. There is no widespread sublingual space edema. Submandibular spaces and glands are within normal limits. Masticator spaces and parotid spaces are within normal limits. SALIVARY GLANDS: Submandibular glands are within normal limits. Parotid glands are within normal limits. LYMPH NODES: No suspicious cervical lymphadenopathy. SOFT TISSUES: Overlying mental protuberance soft tissue swelling has regressed. No progressive superficial soft tissue inflammation. No soft tissue gas. BRAIN, ORBITS AND SINUSES: Stable visible brain parenchyma. Paranasal sinuses remain well aerated. There is stable bubbly opacity and some fluid in both sphenoid sinuses. Middle ears and mastoids are well aerated. BONES: Chronic  dental disease and dental postoperative changes. Bulky severe lucency along the posterior right mandible molar, stable. Similar lucency left mandible canine on series 4 image 27. No acute abnormality. No suspicious bone lesion. VASCULATURE: Major vascular structures in the visible neck and at the skull base are enhancing and appear to be patent. IMPRESSION: 1. Mandible dental disease with regressed dental protuberance superficial soft tissue inflammation and phlegmon. Unclear whether this could be related to nearby left mandible canine dental disease. 2. Stable anterior sublingual space fluid collection (estimated volume 3 mL). Associated rim enhancement is less conspicuous, but this remains most compatible with Abscess (a ranula was also considered but felt less likely). No widespread sublingual space inflammation. 3. Stable mild sphenoid sinus inflammatory changes. Electronically signed by: Helayne Hurst MD 03/19/2024 08:59 AM EDT RP Workstation: [id number]   CT Maxillofacial W Contrast Addendum Date: 03/15/2024 ADDENDUM #1 ADDENDUM: Findings discussed with Dr. Jossie at 5:19 PM on 03/15/24. ---------------------------------------------------- Electronically signed by: Donnice Mania MD 03/15/2024 05:23 PM EDT RP Workstation: [id number]   Result Date: 03/15/2024 ORIGINAL REPORT EXAM: CT Face with contrast 03/15/2024 04:42:34 PM TECHNIQUE: CT of the face was performed with the administration of intravenous contrast. 80 mL of iohexol (OMNIPAQUE) 300 MG/ML solution was administered. Multiplanar reformatted images are provided for review. Automated exposure control, iterative reconstruction, and/or weight based adjustment of the mA/kV was utilized to reduce the radiation dose to as low as reasonably achievable. COMPARISON: None available CLINICAL HISTORY: Submental abscess, concern for dental infection. 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 No mass. SALIVARY GLANDS: No acute abnormality. LYMPH NODES: Multiple subcentimeter cervical lymph nodes in the visualized neck which are likely reactive. No suspicious cervical lymphadenopathy. SOFT TISSUES: There is a nodular region of soft tissue swelling in the submental region slightly right of midline which measures 2.3 x 2.2 x 1.6 cm. There is surrounding stranding and mild associated skin thickening. Stranding extends posteriorly partially involving the subcutaneous fat in the submandibular spaces. Findings are concerning for cellulitis and phlegmon. There is no focal drainable abscess within the subcutaneous tissues in this region. BRAIN, ORBITS AND SINUSES: Bilateral lens replacement. Mucous retention cyst  in the right maxillary sinus. Layering secretions in the bilateral sphenoid sinuses. Recommend clinical correlation for acute sinusitis. BONES: Multiple absent left sided maxillary and mandibular teeth with associated chronic bone loss. Degenerative changes of the right mandibular condyle. No destructive osseous lesion in the facial bones. IMPRESSION: 1. 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 2. Nodular soft tissue swelling in the submental region, measuring 2.3 x 2.2 x 1.6 cm, with surrounding stranding and mild associated skin thickening, concerning for cellulitis and phlegmon. No focal drainable abscess within the subcutaneous tissues in this region. 3. Visualized upper airway remains patent. 4. Layering secretions in the bilateral sphenoid sinuses. Recommend clinical correlation for acute sinusitis. Electronically signed by: Donnice Mania MD  03/15/2024 05:05 PM EDT RP Workstation: [id number]    Labs:  CBC: Recent Labs    03/17/24 0529 03/18/24 0404 03/19/24 0501 03/20/24 0424  WBC 11.8* 11.0* 10.7* 8.3  HGB 13.3 12.7 12.0 12.2  HCT 40.5 38.6 36.9 36.4  PLT 278 280 235 228    COAGS: Recent Labs    03/16/24 0510  INR 1.0  APTT 35    BMP: Recent Labs    03/17/24 0529 03/18/24 0404 03/19/24 0501 03/20/24 0424  NA 140 139 138 141  K 3.2* 3.6 3.3* 3.1*  CL 105 104 108 106  CO2 25 22 22 25   GLUCOSE 115* 121* 111* 103*  BUN 9 9 10 11   CALCIUM  8.4* 8.4* 8.1* 8.3*  CREATININE 0.54 0.51 0.43* 0.48  GFRNONAA >60 >60 >60 >60    LIVER FUNCTION TESTS: Recent Labs    03/15/24 1313  BILITOT 1.0  AST 35  ALT 27  ALKPHOS 58  PROT 8.2*  ALBUMIN 3.3*    TUMOR MARKERS: No results for input(s): AFPTM, CEA, CA199, CHROMGRNA in the last 8760 hours.  Assessment and Plan:  Sublingual fluid collection: Olivia Martin is a 83 y.o. female with a history of dementia, currently on hospice, who was brought to the ED for perioral and submental swelling concerning for Ludwig angina. CT imaging reveals a sublingual fluid collection as well as a nodular submental swelling with no drainable collection. ENT originally consulted and recommending continued IV antibiotics. Repeat CT discussed with IR physician Dr. ONEIDA Specking who approved patient for possible percutaneous aspiration. Procedure to be performed under moderate sedation.  -NPO since midnight -On ppx Lovenox , held this afternoon -INR 1.0 on 10/23; repeat pending   Risks and benefits of aspiration were discussed with the patient including bleeding, infection, damage to adjacent structures, and sepsis.  All of the patient's questions were answered, patient is agreeable to proceed. Consent signed and in chart.   Thank you for allowing our service to participate in Olivia Martin 's care.    Electronically Signed: Glennon CHRISTELLA Bal, PA-C   03/20/2024,  11:54 AM     I spent a total of 40 Minutes in face to face in clinical consultation, greater than 50% of which was counseling/coordinating care for sublingual fluid collection.

## 2024-03-20 NOTE — Procedures (Signed)
 Interventional Radiology Procedure Note  Procedure: US  guided aspiration of submental abscess  Complications: None  Estimated Blood Loss: None  Findings: 18 G spinal needle advanced into focal fluid collection in submental space. Aspiration yielded 3 mL of purulent fluid. US  shows complete decompression after aspiration. Fluid sent for culture.  Olivia Martin. Luverne, M.D Pager:  [phone]

## 2024-03-20 NOTE — Plan of Care (Signed)
  Problem: Education: Goal: Knowledge of General Education information will improve Description: Including pain rating scale, medication(s)/side effects and non-pharmacologic comfort measures Outcome: Progressing   Problem: Coping: Goal: Level of anxiety will decrease Outcome: Progressing   Problem: Elimination: Goal: Will not experience complications related to urinary retention Outcome: Progressing   Problem: Safety: Goal: Ability to remain free from injury will improve Outcome: Progressing

## 2024-03-20 NOTE — Plan of Care (Signed)

## 2024-03-20 NOTE — Care Management Important Message (Signed)
 Important Message  Patient Details  Name: Olivia Martin MRN: [id number] Date of Birth: 09-26-1940   Important Message Given:  Yes - Medicare IM     Torion Hulgan W, CMA 03/20/2024, 2:47 PM

## 2024-03-20 NOTE — Discharge Summary (Signed)
 Olivia Martin FMW:[id number] DOB: 1940-12-21 DOA: 03/15/2024  PCP: Valora Lynwood FALCON, MD  Admit date: 03/15/2024 Discharge date: 03/20/2024  Time spent: 35 minutes  Recommendations for Outpatient Follow-up:  Oral surgery f/u F/u aspirated fluid culture Pcp f/u    Discharge Diagnoses:  Principal Problem:   Submental space infection Active Problems:   Essential hypertension   Hyperlipidemia   Alzheimer's dementia (HCC)   Hypokalemia   Obesity (BMI 30-39.9)   Pressure injury of skin   Discharge Condition: improved  Diet recommendation: dysphagia  Filed Weights   03/15/24 1308 03/16/24 0150  Weight: 96.2 kg 88.8 kg    History of present illness:  From admission h and p Olivia Martin is a 83 y.o. female with medical history significant of HTN, HLD, dementia, depression, obesity, currently under hospice care, who presents with neck swelling and pain.   Per her daughter at the bedside, pt started having swelling and pain in the right side of her neck, under right chin area with a knot 1 weak ago.  Pt was seen by hospice.  They switched her ciprofloxacin which is for UTI treatment to Keflex.  Patient feels better in terms of right-sided neck pain, but she then developed swelling and pain in the front neck under her submandible area.  Patient does not have fever or chills. No chest pain, cough, SOB.  No nausea, vomiting, diarrhea or abdominal pain.  Her symptoms of UTI have resolved after taking 4 days of Cipro.  Patient's mental status is at baseline per her daughter.    Hospital Course:   # Submental space infection Abscess anterior floor of mouth musculature 2x2 cm, also submental soft tissue swelling. EDP unable to drain fluid for culture. No respiratory compromise at the moment. Failed outpt keflex. ENT advises abx, hopes to avoid surgery. Does appear to be improving. Mrsa swab neg. ENt clarifies this is not ludwig angina. IR aspiration 10/27. Treated with unasyn, plan  for finishing course augmentin, outpt f/u oral surgery.    # Vanc hypersensitivity reaction Resolved with slowing infusion   # Advanced dementia Under hospice care, goals remain treat the treatable, daughter affirms does want to treat this infection. Bedbound at baseline.    # Hypokalemia Home hydrochlorothiazide likely contributory. Resolved. Home hydrochlorothiazide held.   # HTN Bp controlled. Home hydrochlorothiazide on hold given hypokalemia and normotension off the med  Procedures: IR aspiration   Consultations: ENT, IR  Discharge Exam: Vitals:   03/20/24 1430 03/20/24 1434  BP: (!) 118/58 112/67  Pulse: (!) 54 (!) 52  Resp: 12 18  Temp:  97.8 F (36.6 C)  SpO2: 94% 100%    General: NAD Cardiovascular: RRR Respiratory: CTAB Skin: interval improvement in swelling and erythema of nodule under chin  Discharge Instructions   Discharge Instructions     Diet - low sodium heart healthy   Complete by: As directed    Increase activity slowly   Complete by: As directed    No wound care   Complete by: As directed       Allergies as of 03/20/2024       Reactions   Prednisone Swelling, Other (See Comments)   felt like I couldn't breathe   Vancomycin  Other (See Comments)   Hypersensitivity reaction (red man), resolved with slowing the infusion        Medication List     PAUSE taking these medications    hydrochlorothiazide 25 MG tablet Wait to take this until your doctor  or other care provider tells you to start again. Commonly known as: HYDRODIURIL Take 25 mg by mouth daily.       STOP taking these medications    cephALEXin 250 MG capsule Commonly known as: KEFLEX       TAKE these medications    amoxicillin-clavulanate 875-125 MG tablet Commonly known as: AUGMENTIN Take 1 tablet by mouth 2 (two) times daily for 10 days.   baclofen 10 MG tablet Commonly known as: LIORESAL Take 10 mg by mouth at bedtime.   divalproex  125 MG DR  tablet Commonly known as: DEPAKOTE  Take 125 mg by mouth 2 (two) times daily.   oxyCODONE 5 MG immediate release tablet Commonly known as: Oxy IR/ROXICODONE Take 5 mg by mouth every 4 (four) hours as needed.   pantoprazole  40 MG tablet Commonly known as: PROTONIX  Take 40 mg by mouth daily.   traZODone  50 MG tablet Commonly known as: DESYREL  Take 50 mg by mouth at bedtime as needed.       Allergies  Allergen Reactions   Prednisone Swelling and Other (See Comments)    felt like I couldn't breathe   Vancomycin  Other (See Comments)    Hypersensitivity reaction (red man), resolved with slowing the infusion    Follow-up Information     Crisp, Hanibal A, DMD Follow up.   Specialties: Dentistry, Oral Surgery Why: His office may also be located at [address] [place], [place] [address], with phone number [phone] Contact information: [address] [place] [address] [place] [address] [phone]         Valora Lynwood FALCON, MD Follow up.   Specialty: Family Medicine Contact information: [address] [place] [address] [phone]                  The results of significant diagnostics from this hospitalization (including imaging, microbiology, ancillary and laboratory) are listed below for reference.    Significant Diagnostic Studies:   Microbiology: Recent Results (from the past 240 hours)  Culture, blood (Routine X 2) w Reflex to ID Panel     Status: None   Collection Time: 03/15/24  8:00 PM   Specimen: BLOOD  Result Value Ref Range Status   Specimen Description BLOOD BLOOD RIGHT FOREARM  Final   Special Requests   Final    Blood Culture adequate volume BOTTLES DRAWN AEROBIC AND ANAEROBIC   Culture   Final    NO GROWTH 5 DAYS Performed at [facility], [address], [place], [place] [address]    Report Status 03/20/2024 FINAL  Final  Culture, blood (Routine X 2) w Reflex to ID Panel     Status: None   Collection Time: 03/15/24  8:15  PM   Specimen: BLOOD  Result Value Ref Range Status   Specimen Description BLOOD RIGHT ANTECUBITAL  Final   Special Requests   Final    BOTTLES DRAWN AEROBIC AND ANAEROBIC Blood Culture adequate volume   Culture   Final    NO GROWTH 5 DAYS Performed at [facility], [address], [place], [place] [address]    Report Status 03/20/2024 FINAL  Final  MRSA Next Gen by PCR, Nasal     Status: None   Collection Time: 03/17/24  4:49 PM   Specimen: Nasal Mucosa; Nasal Swab  Result Value Ref Range Status   MRSA by PCR Next Gen NOT DETECTED NOT DETECTED Final    Comment: (NOTE) The GeneXpert MRSA Assay (FDA approved for NASAL specimens  only), is one component of a comprehensive MRSA colonization surveillance program. It is not intended to diagnose MRSA infection nor to guide or monitor treatment for MRSA infections. Test performance is not FDA approved in patients less than 89 years old. Performed at [facility], [address] [place], [place] [address]      Labs: Basic Metabolic Panel: Recent Labs  Lab 03/15/24 1313 03/16/24 0510 03/17/24 0529 03/18/24 0404 03/19/24 0501 03/20/24 0424  NA 137 139 140 139 138 141  K 3.1* 3.1* 3.2* 3.6 3.3* 3.1*  CL 98 99 105 104 108 106  CO2 26 27 25 22 22 25   GLUCOSE 120* 143* 115* 121* 111* 103*  BUN 17 15 9 9 10 11   CREATININE 0.54 0.62 0.54 0.51 0.43* 0.48  CALCIUM  9.2 9.1 8.4* 8.4* 8.1* 8.3*  MG 2.4 2.2  --   --   --   --   PHOS 2.8  --   --   --   --   --    Liver Function Tests: Recent Labs  Lab 03/15/24 1313  AST 35  ALT 27  ALKPHOS 58  BILITOT 1.0  PROT 8.2*  ALBUMIN 3.3*   No results for input(s): LIPASE, AMYLASE in the last 168 hours. No results for input(s): AMMONIA in the last 168 hours. CBC: Recent Labs  Lab 03/15/24 1349 03/16/24 0510 03/17/24 0529 03/18/24 0404 03/19/24 0501 03/20/24 0424  WBC 12.0* 11.3* 11.8* 11.0* 10.7* 8.3  NEUTROABS 7.9*  --   --   --   --   --   HGB 15.0  14.8 13.3 12.7 12.0 12.2  HCT 45.0 44.2 40.5 38.6 36.9 36.4  MCV 95.9 95.1 96.0 96.7 98.4 97.1  PLT 316 316 278 280 235 228   Cardiac Enzymes: No results for input(s): CKTOTAL, CKMB, CKMBINDEX, TROPONINI in the last 168 hours. BNP: BNP (last 3 results) No results for input(s): BNP in the last 8760 hours.  ProBNP (last 3 results) No results for input(s): PROBNP in the last 8760 hours.  CBG: Recent Labs  Lab 03/18/24 1723 03/19/24 0751 03/19/24 1158 03/20/24 0128 03/20/24 0944  GLUCAP 115* 111* 99 126* 111*       Signed:  Devaughn KATHEE Ban MD.  [facility] 03/20/2024, 3:29 PM

## 2024-03-20 NOTE — TOC Transition Note (Signed)
 Transition of Care [facility]) - Discharge Note   Patient Details  Name: Olivia Martin MRN: [id number] Date of Birth: 08-22-1940  Transition of Care [facility]) CM/SW Contact:  Alvaro Louder, LCSW Phone Number: 03/20/2024, 4:19 PM   Clinical Narrative:    LCSWA confirmed with MD that patient is stable for discharge. LCSWA notified the patient and they are in agreement with discharge. LCSWA confirmed with family that they will be at home to receive patient. Transport arranged with [facility] for next available.  TOC signing off    Final next level of care: Home/Self Care Barriers to Discharge: No Barriers Identified   Patient Goals and CMS Choice            Discharge Placement              Patient chooses bed at:  (Home) Patient to be transferred to facility by: [facility] Name of family member notified: Lonell Patient and family notified of of transfer: 03/20/24  Discharge Plan and Services Additional resources added to the After Visit Summary for                                       Social Drivers of Health (SDOH) Interventions SDOH Screenings   Food Insecurity: No Food Insecurity (03/16/2024)  Housing: Low Risk  (03/16/2024)  Transportation Needs: No Transportation Needs (03/16/2024)  Utilities: Not At Risk (03/16/2024)  Social Connections: Patient Unable To Answer (03/16/2024)  Tobacco Use: Medium Risk (03/15/2024)     Readmission Risk Interventions     No data to display

## 2024-03-25 LAB — AEROBIC/ANAEROBIC CULTURE W GRAM STAIN (SURGICAL/DEEP WOUND): Culture: NO GROWTH

## 2024-03-27 IMAGING — DX DG CHEST 2V
2 series · 2 of 2 positions shown · non-contrast
Comparison: 11/24/2020 chest radiograph.

CLINICAL DATA: Right shoulder and upper right chest pain

EXAM:
CHEST - 2 VIEW

[chest lat]
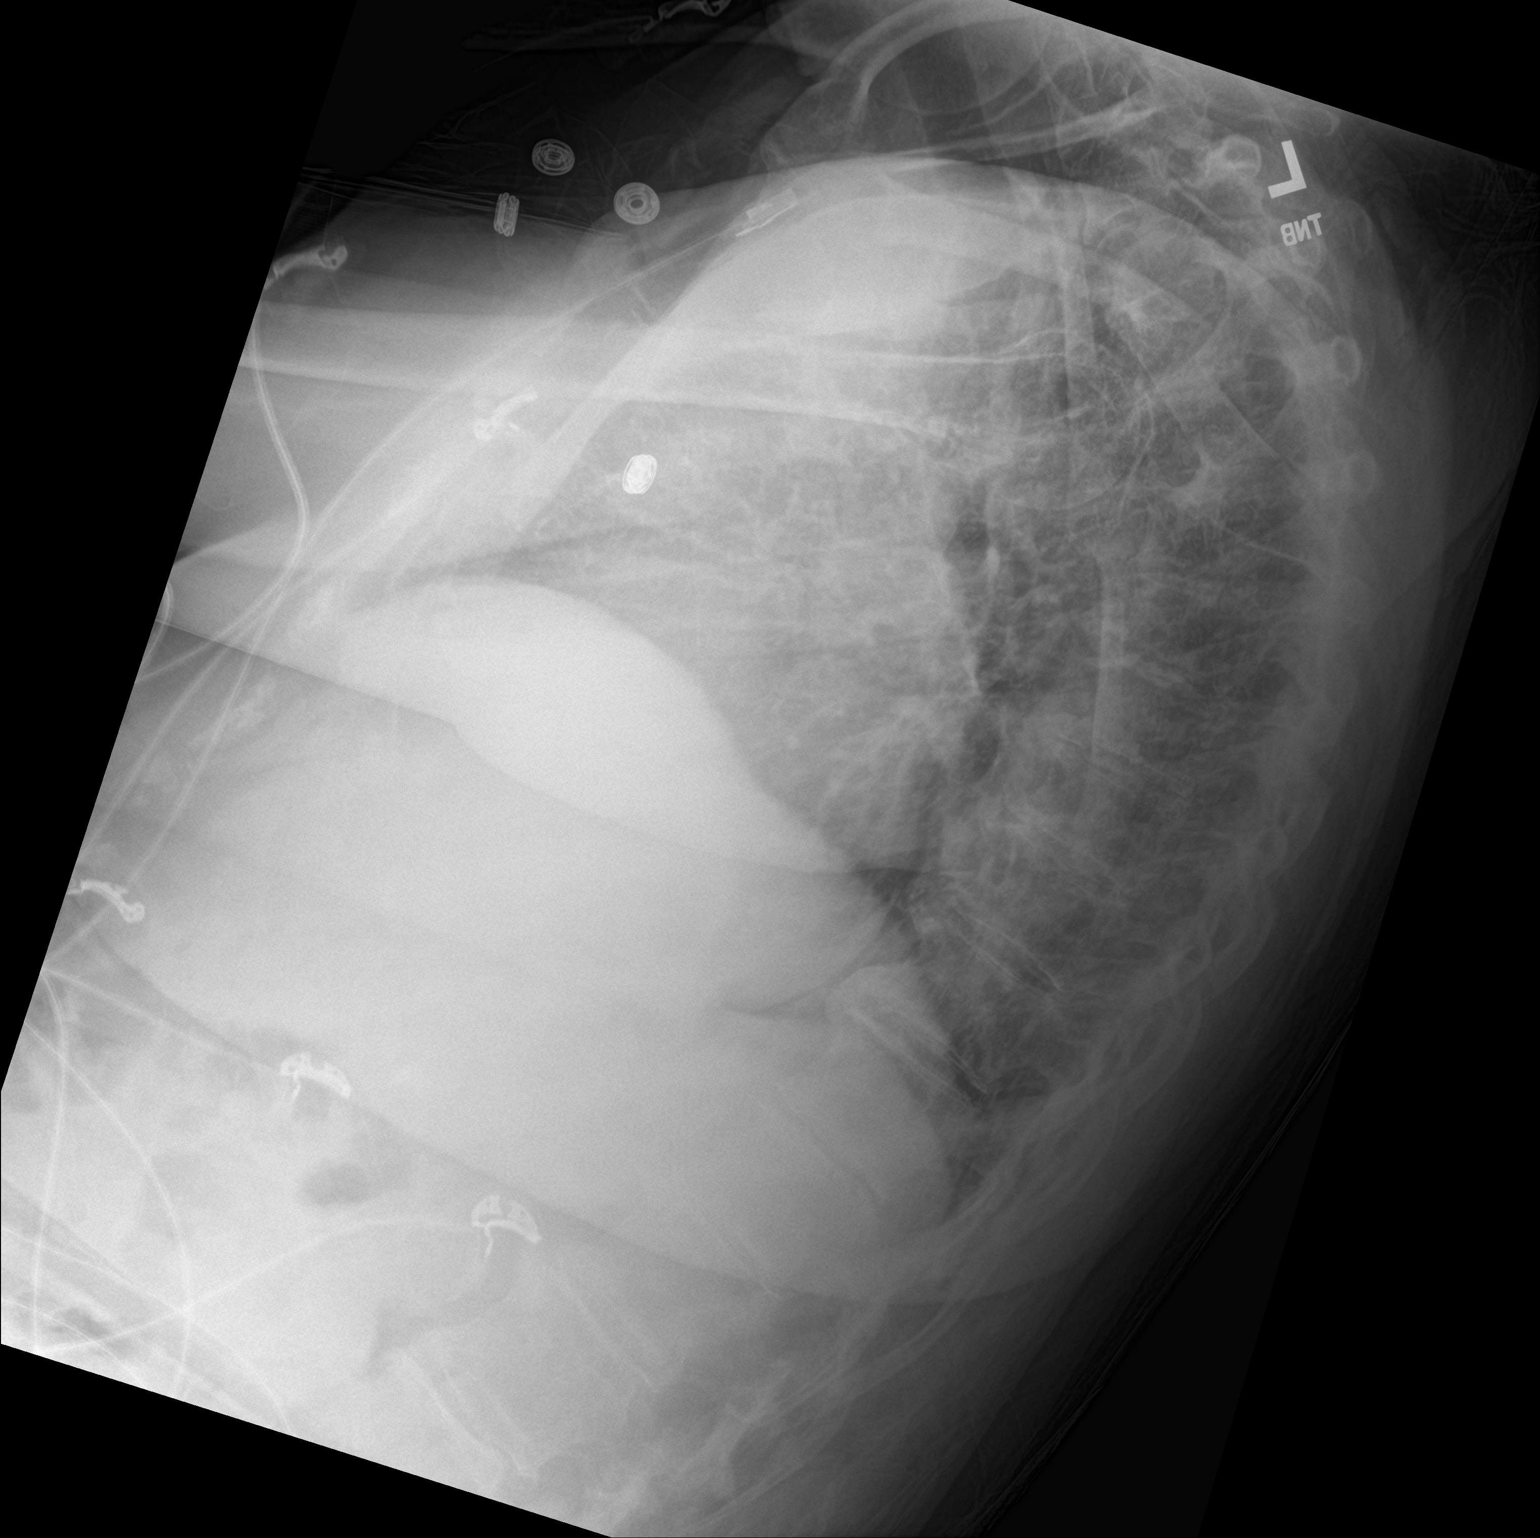

[chest ap]
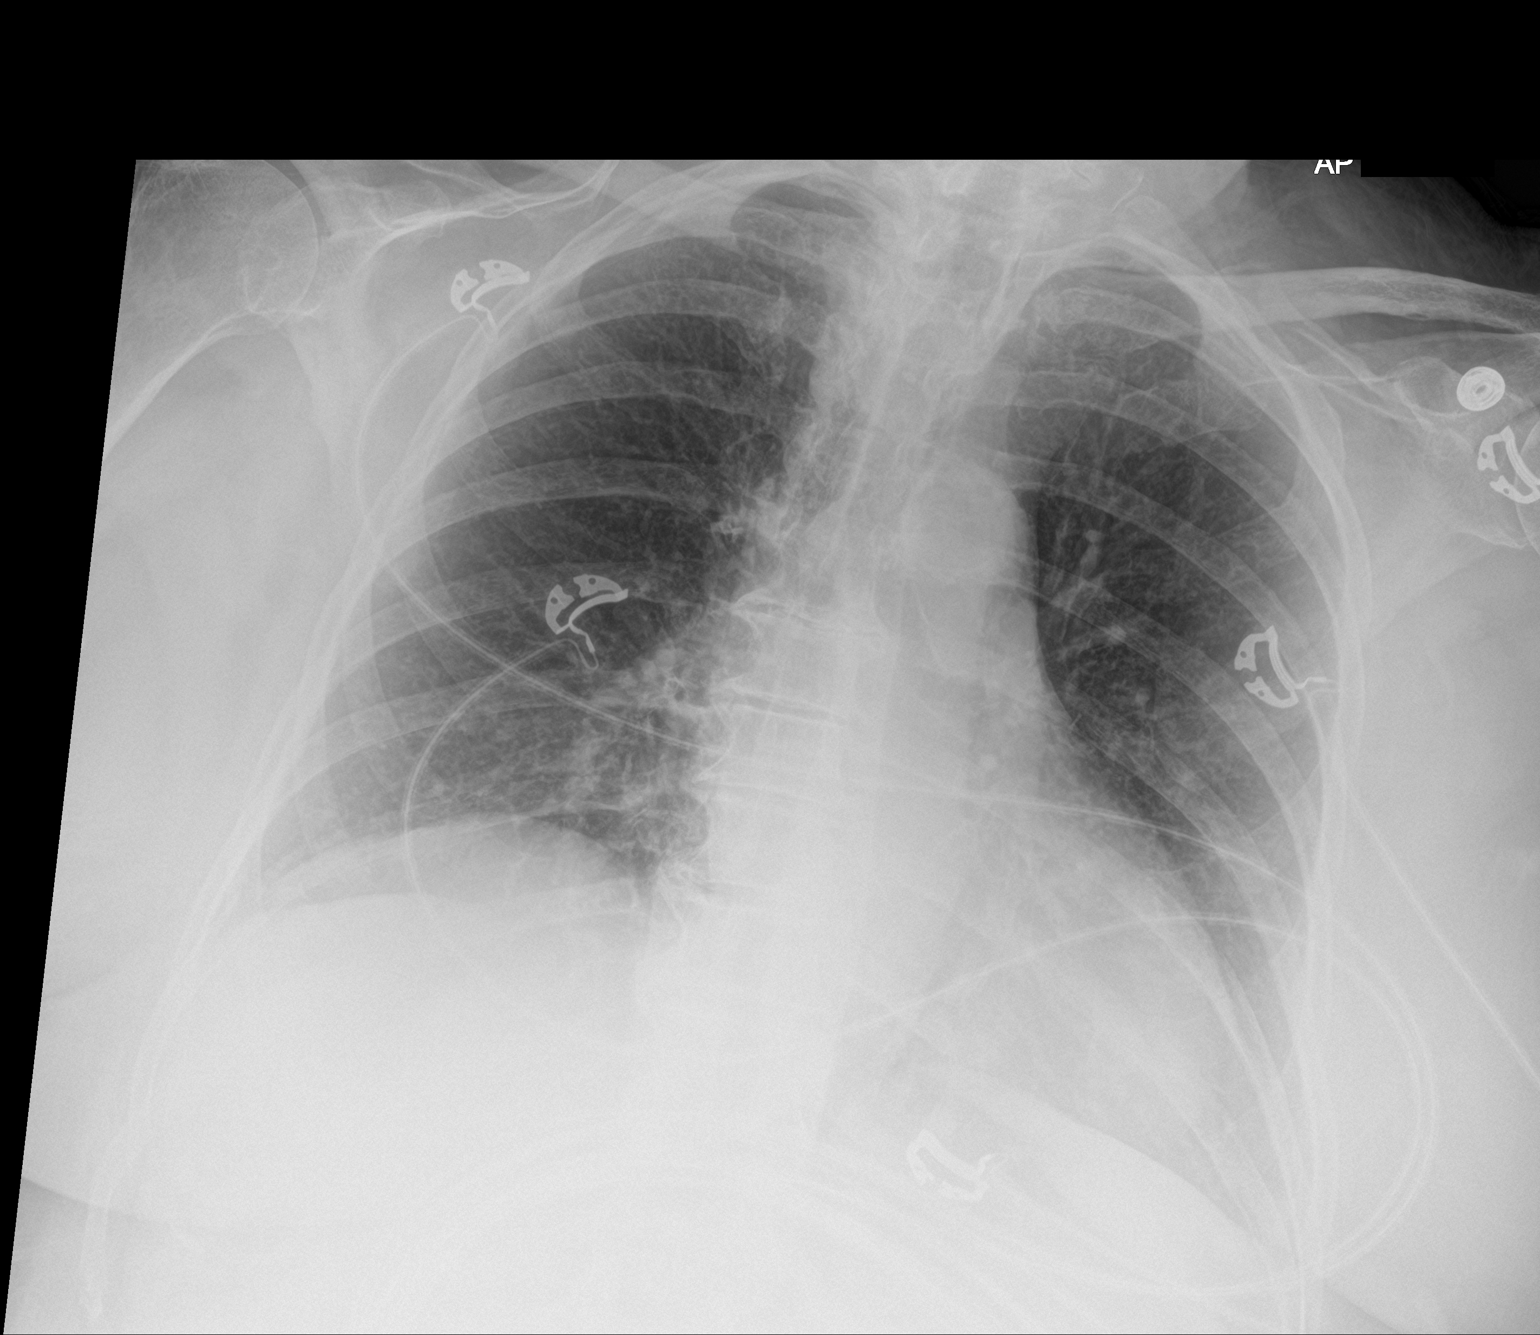

[2 of 2 positions shown; findings below may reference images not displayed]

FINDINGS: Stable cardiomediastinal silhouette with normal heart size. No
pneumothorax. No pleural effusion. Lungs appear clear, with no acute
consolidative airspace disease and no pulmonary edema.
IMPRESSION: No active cardiopulmonary disease.
